# Patient Record
Sex: Male | Born: 1975 | Race: White | Hispanic: No | Smoking: Never smoker
Health system: Southern US, Community
[De-identification: ages and names within clinical notes are randomized; demographics above are authoritative.]

## PROBLEM LIST (undated history)

## (undated) DIAGNOSIS — R569 Unspecified convulsions: Secondary | ICD-10-CM

## (undated) HISTORY — PX: TONSILLECTOMY: SUR1361

---

## 2005-02-09 ENCOUNTER — Emergency Department (HOSPITAL_COMMUNITY): Admission: EM | Admit: 2005-02-09 | Discharge: 2005-02-09 | Payer: Self-pay | Admitting: Emergency Medicine

## 2006-01-15 ENCOUNTER — Emergency Department (HOSPITAL_COMMUNITY): Admission: EM | Admit: 2006-01-15 | Discharge: 2006-01-15 | Payer: Self-pay | Admitting: Emergency Medicine

## 2007-06-11 ENCOUNTER — Emergency Department (HOSPITAL_COMMUNITY): Admission: EM | Admit: 2007-06-11 | Discharge: 2007-06-12 | Payer: Self-pay | Admitting: Emergency Medicine

## 2007-07-13 ENCOUNTER — Inpatient Hospital Stay (HOSPITAL_COMMUNITY): Admission: EM | Admit: 2007-07-13 | Discharge: 2007-07-15 | Payer: Self-pay | Admitting: Emergency Medicine

## 2008-05-29 IMAGING — CR DG CERVICAL SPINE COMPLETE 4+V
7 series · 7 of 7 positions shown · non-contrast
Comparison: 01/15/06.

CLINICAL DATA: Seizure.  Fell and hit head.
 CERVICAL SPINE ? 5 VIEW:
TECHNIQUE: Contiguous axial images were obtained from the base of the skull through the vertex according to standard protocol without contrast.

[view not recorded (1 of 7)]
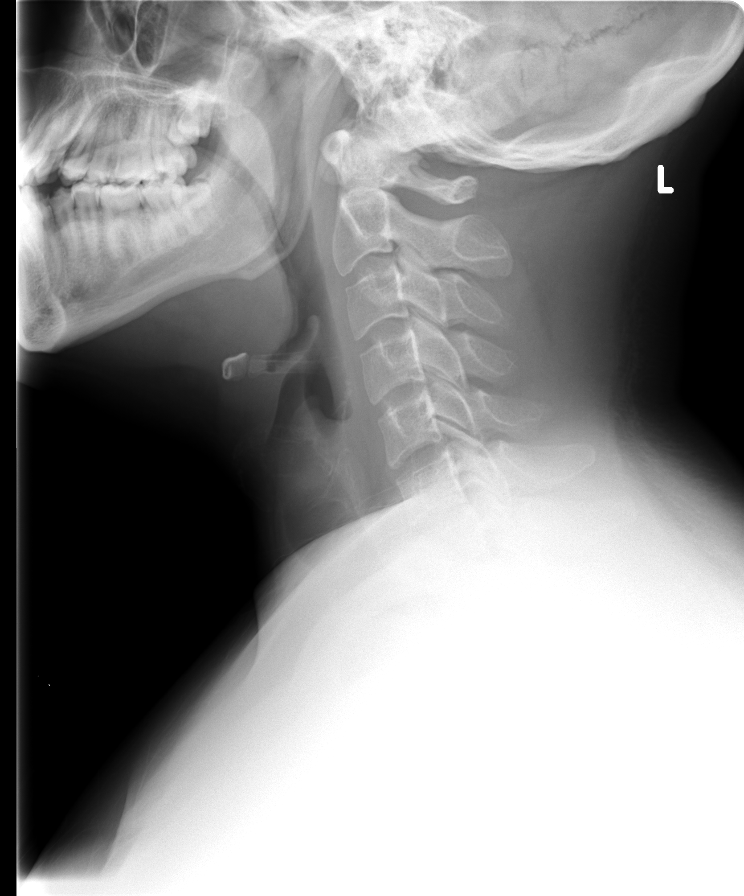

[view not recorded (2 of 7)]
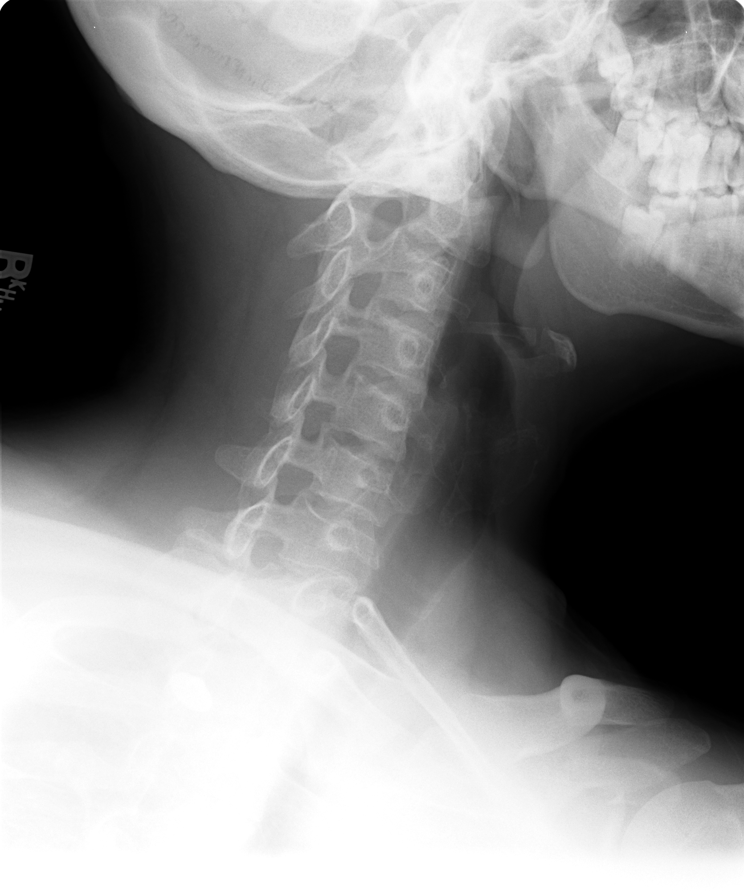

[view not recorded (3 of 7)]
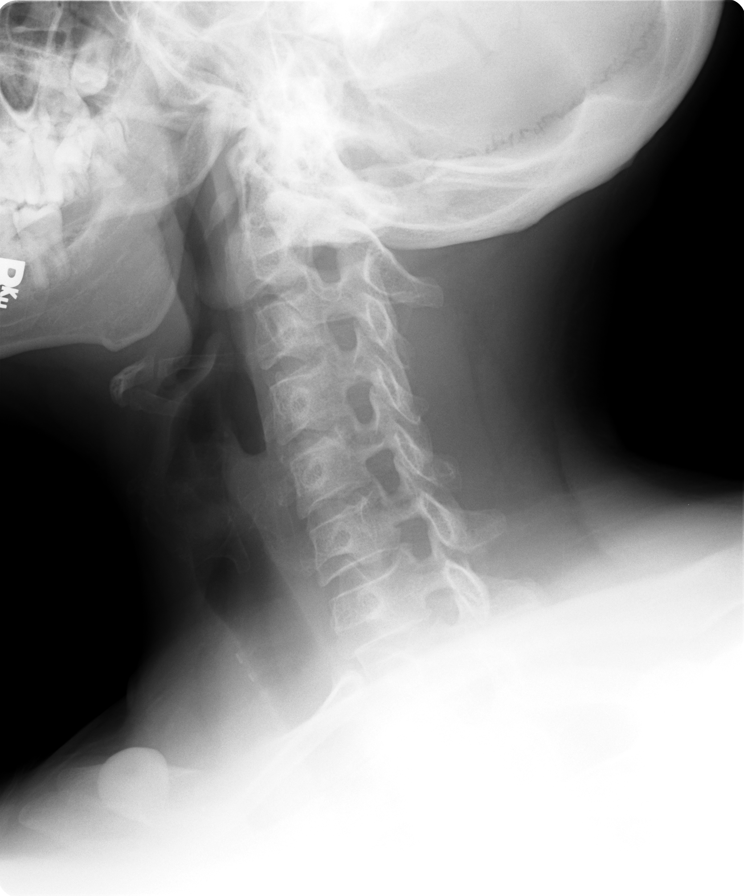

[view not recorded (4 of 7)]
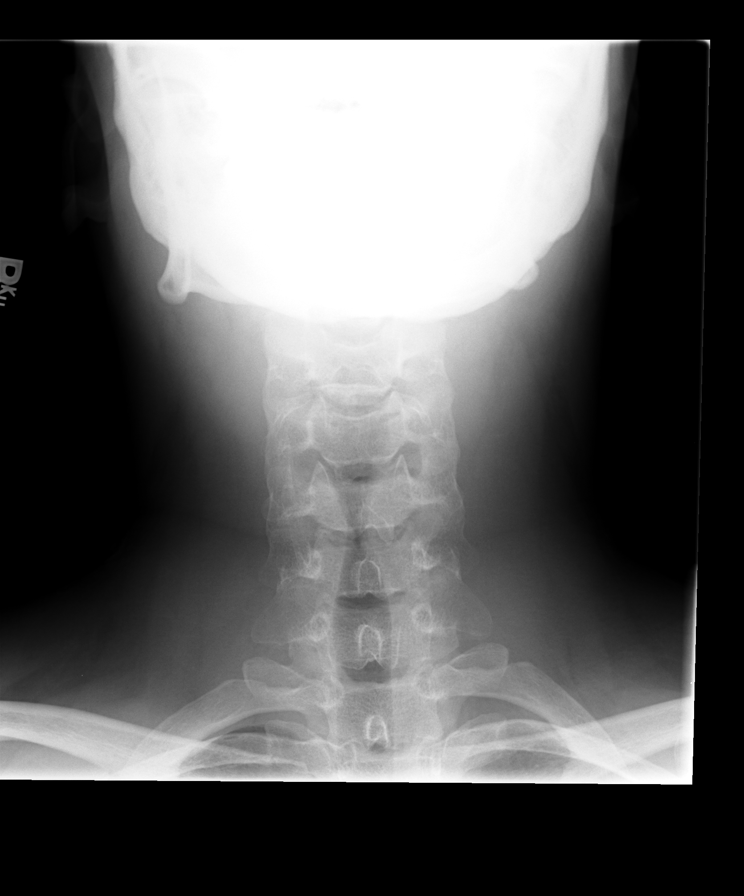

[view not recorded (5 of 7)]
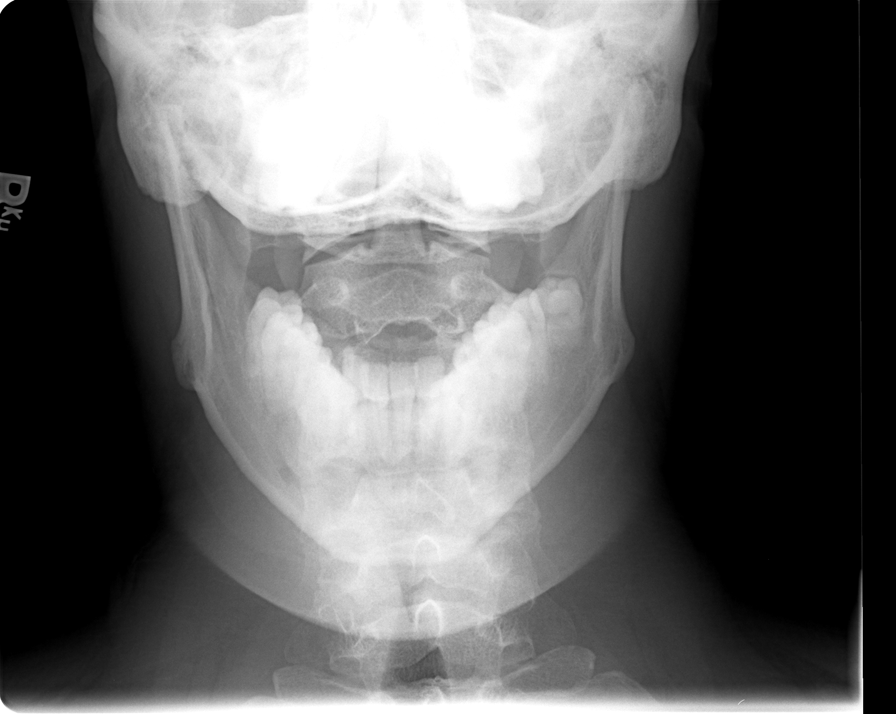

[view not recorded (6 of 7)]
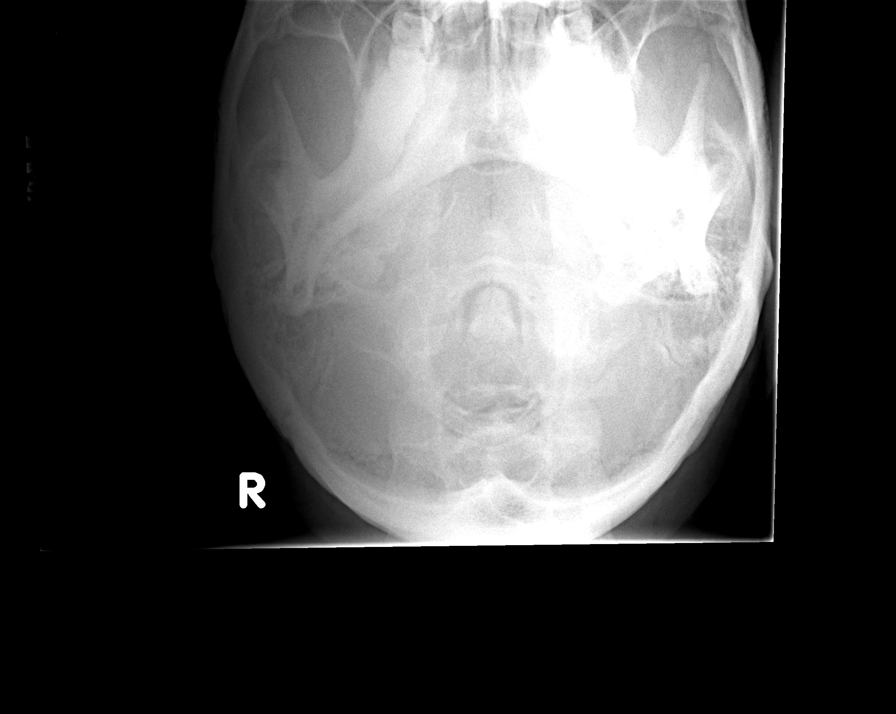

[view not recorded (7 of 7)]
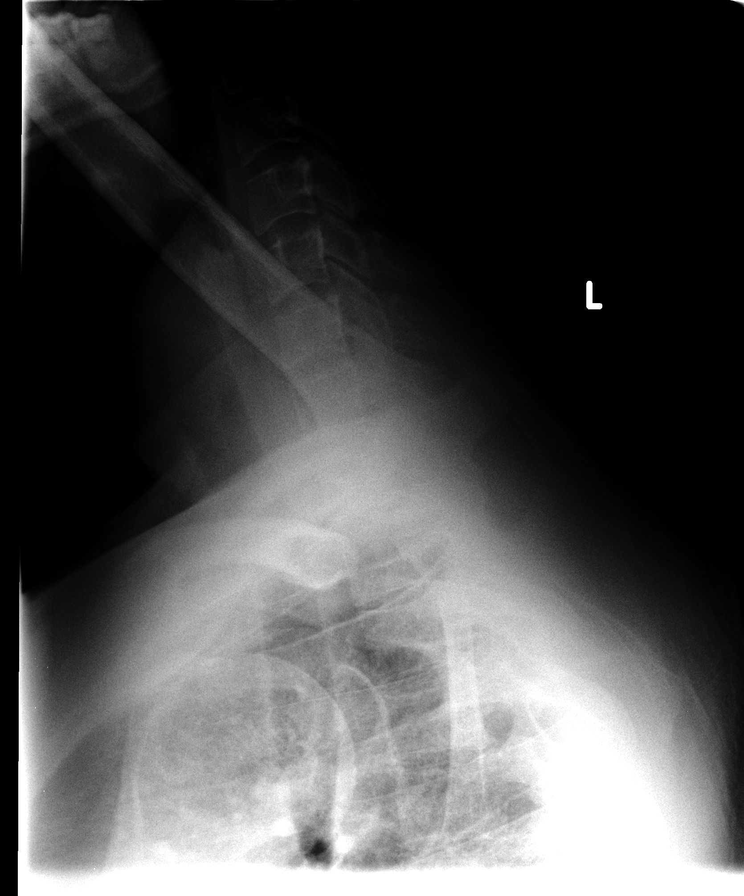

[7 of 7 positions shown; findings below may reference images not displayed]

FINDINGS: There is no evidence of cervical spine fracture or prevertebral soft tissue swelling.  Alignment is normal.  No other significant bone abnormalities are identified.
IMPRESSION: Negative cervical spine radiographs.
 HEAD CT WITHOUT CONTRAST:
FINDINGS: There is no evidence of intracranial hemorrhage, brain edema, acute infarct, mass lesion, or mass effect.  No other intraaxial abnormalities are seen, and the ventricles are within normal limits.  No abnormal extraaxial fluid collections or masses are identified.  No skull abnormalities are noted.
IMPRESSION: Negative non-contrast head CT.

## 2011-05-02 NOTE — Discharge Summary (Signed)
[name], [name]   MEDICAL RECORD NO.:  [id number]          PATIENT TYPE:  INP   LOCATION:  A212                          FACILITY:  [facility]   PHYSICIAN:  Jason Shipper, MD     DATE OF BIRTH:  07-12-1976   DATE OF ADMISSION:  07/13/2007  DATE OF DISCHARGE:  LH                               DISCHARGE SUMMARY   Please see H&P dictated by Dr. Catalina [name] for details for details  regarding the patient's presenting illness.   PRIMARY MEDICAL DOCTOR:  Dr. Artis [name] from [facility].   DISCHARGE DIAGNOSES:  1. Seizure disorder.  2. Recent herpes meningitis, treated.  3. Recent otitis media, treated.   BRIEF HOSPITAL COURSE:  Briefly, this is a 35 year old Caucasian male  who has a history of seizure disorder diagnosed earlier this year.  Prior to June 2008 he has had about three seizure episodes over the  period of the last 3 years.  The patient mentioned that he sustained a  head injury back in 2001 and had a concussion at that time.  He was in  [facility] from June 18 up until June 24 with seizure disorder.  The patient underwent an extensive evaluation at [facility].  He  had a CSF analysis done which did show 200 wbc's with 42% mononuclear,  58% polys - actually, I do not know they are absolute counts or  percentage at this time.  In any case, he was admitted to the hospital.  He had an MRI as well which showed abnormalities which were suggestive  of possible viral encephalitis.  It showed signal abnormalities in the  bilateral, anterior and medial temporal lobes and inferior frontal  lobes.  The patient also had a CT which did not show any abnormalities.  Apparently, he did have an EEG done as well which showed intermittent  surges of slowing noted over the temporal regions that were frequently  maximal on the left.  It should be noted that left side was where he  sustained his head injury.  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  He is  currently not on that any more.  He was also started on Keppra.   The patient presented here to the ED 2 days ago with seizure activity.  He apparently had missed 2 days' worth of Keppra.  He had two seizures  in the ED; hence, he was admitted to the hospital.  We obtained all the  records from [facility] and I have summarized them above.  He has not had  any seizures while he has been on the floor.  He is feeling much better  today.  He has an appointment with the neurologist at [facility] in October  of this year.  Since our neurologist is currently on vacation, and since  the patient has been seizure-free, I believe he can be discharged home  with close followup with Dr. Gerilyn [name] in the next week or two.   Today, the patient is feeling  well.  Denies any headaches, no seizure  activity, no other complaints.  He has been tolerating p.o.  His vital  signs have been stable, he has been afebrile, saturating well.  His  blood work from yesterday was all good.  His Dilantin level was 10.4  yesterday; however, it is probably not an accurate measurement since he  had received Dilantin only 1 day prior.  Hence, I have told him that the  plan is to send him on Keppra and Dilantin for now.  I believe the  Dilantin can be discontinued, but I will defer this issue to Dr.  Gerilyn [name].  We will provide the patient with an information sheet on  Dilantin and teach him about possible side effects and adverse effects.  Prescriptions for both of the above will be written.  Appointment will  be made with Dr. Gerilyn [name].   DISCHARGE MEDICATIONS:  1. Keppra 750 mg p.o. b.i.d.  2. Dilantin 300 mg q.h.s., DAW.  3. Vicodin as needed.  4. He may not continue with Amoxicillin any more.   FOLLOWUP:  1. With Dr. Gerilyn [name] as soon as possible.  2. With his PMD as needed.  3. With [facility] as scheduled.   DIET:  No restrictions.    PHYSICAL EXAMINATION:  He has been told not to drive for at least 26  weeks - that is 6 months - or until he is told so otherwise by the  neurologist.   IMAGING STUDIES DONE HERE:  Include cervical spine film which was  negative and a CT head which was negative as well.   Total time at discharge 35 minutes.      Jason Shipper, MD  Electronically Signed     GK/MEDQ  D:  07/15/2007  T:  07/15/2007  Job:  [id number]   cc:   Darleen Crocker A. Jason [name], M.D.  Fax: [phone]   Madelin Rear. Sherwood Gambler, MD  Fax: [phone]

## 2011-05-02 NOTE — H&P (Signed)
NAMECORNELL, BOURBON NO.:  [id number]   MEDICAL RECORD NO.:  [id number]          PATIENT TYPE:  INP   LOCATION:  A212                          FACILITY:  [facility]   PHYSICIAN:  Catalina Pizza, M.D.        DATE OF BIRTH:  Apr 06, 1976   DATE OF ADMISSION:  07/13/2007  DATE OF DISCHARGE:  LH                              HISTORY & PHYSICAL   PRIMARY CARE PHYSICIAN:  Madelin Rear. Sherwood Gambler, MD.   [facility]   CHIEF COMPLAINT:  Tonic-clonic gran mal seizure x2.   HISTORY OF PRESENT ILLNESS:  Mr. Jason [name] is a 35 year old gentleman who had  a history of seizure, first starting in 2006 x1, and then had another  one in 2007.  He was never put on any medicines at those times.  In June  2008 he had a seizure at work and was taken to [facility], where  as best as I can understand he was diagnosed with herpes meningitis.  He  was treated with acyclovir (it sounds like) IV for 10 days, and then  continued oral medication for another week.  At that time he was started  on Keppra, and has remained on that medication up until 2 days ago --  when he ran out of it, missing 2 doses.  He was at [facility] yesterday,  when he had another episode of approximately one minute of tonic-clonic  type seizure.  He was brought into the emergency department for further  evaluation.  He was loaded with Dilantin and was getting ready to be  discharged to home, when he had another 2-3 minute episode of full tonic-  clonic seizure activity.  He was given a dose of Ativan and admitted for  further evaluation.  Of note, he was seen 2 days ago in the emergency  department, and felt to have an otitis media.  He was started on  amoxicillin and Vicodin for pain control.   PAST MEDICAL HISTORY:  Only significant for the seizure and questioned  meningitis.   MEDICATIONS:  1. Vicodin 5/500 one q.6 h. p.r.n. for pain.  2. Keppra 750 mg b.i.d.  3. Amoxicillin 500 mg q.8 h. x7 days.   FAMILY  HISTORY:  Mother and father are still alive.  Father has a  history of hypertension.  Mother with no health problems.  No other  brothers or sisters.   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  He states he occassionally drinks  alcohol, but very rarely.  He does not smoke and no other drugs.  He is  married and has a 62-year-old son.   REVIEW OF SYSTEMS:  The patient denies any fever or chills.  No nausea  or vomiting.  Occasional headache-type pain.  Denies any specific ear  pain at this time.  Does mention episodes of vertigo when rolling over  in bed, and sometimes has upward gaze.  He denies any chest pain and no  shortness of breath.  No  abdominal pains.  No problems with bowel or  bladder.  No other neurologic complaints.  No lower extremity swelling.   OBJECTIVE:  VITAL SIGNS:  Temperature 98.5, blood pressure 117/60, pulse  103, respirations 16, saturations 97-100% on 2 liters oxygen.  GENERAL:  This is an obese white male, lying in bed in no acute  distress.  HEENT:  Atraumatic.  Has very poor dentition and does have some bite  marks on his tongue on both sides.  Oropharynx though is clear; no  thyromegaly and no carotid bruits.  No JVD.  LUNGS:  Clear to auscultation bilaterally.  CARDIOVASCULAR:  Regular rate and rhythm; no murmur, gallop or rub.  ABDOMEN:  Soft, nontender, nondistended.  Positive bowel sounds.  EXTREMITIES:  No lower extremity edema.  NEUROLOGIC:  The patient is alert and oriented x3.  Cranial nerves II-  XII are intact.  Muscle strength 5/5 in all extremities.  I did not  appreciate any nystagmus.  Extraocular movements all intact.   LABORATORY DATA:  CBC:  White count 7.5, hemoglobin 14.2, platelet count  319.  No signs of alcohol. BMET was normal; BUN 14, creatinine 1.00.  Urinalysis only showed 30 of protein, but negative for everything else.  Urine microbe just showed a few bacteria.   Urine drug screen was  negative for everything.  He did not have any other scans done.   IMPRESSION:  This is a 35 year old gentleman with tonic-clonic seizures  acutely; question whether related to previous diagnosis of herpes  meningitis.   ASSESSMENT AND PLAN:  1. GRAN MAL-TYPE SEIZURE.  He was started and loaded on Dilantin      yesterday.  He did miss one full day of Keppra due to running out,      and questioned whether this exacerbated or brought on these type      seizures.  He was loaded on Dilantin and will continue on the      Dilantin, as well as resume his Keppra.  He has not had any      followup with neurologist since his episode in June 2008.  We will      get a neurologist consult with Dr. Gerilyn Pilgrim to see if he has any      further input related to herpes meningitis causing these type      seizures; and question whether the patient needs further treatment      for that, although it does sound like he did have a prolonged      treatment at [facility] (do not have those records available      at this time).  We will recheck a Dilantin level in the morning.  2. OTITIS MEDIA.  He does not have significant problems at this time;      no effusions in his ear.  We will continue the amoxicillin for now.      He states that it is better than it was 2 or 3 days ago.  3. VERTIGO.  Unclear whether this is related to the seizure, but      states that he has been having this off and on consistently since      he was seen at [facility] in June 2008.  Question whether      this is related to irritation of his vestibular nerve.  No signs of      Ramsey-Hunt type symptoms though.   DISPOSITION:  The patient will continued to be monitored on telemetry  with seizure precautions.  We are awaiting a neurologist consult as well  as a Dilantin level.      Catalina Pizza, M.D.  Electronically Signed     ZH/MEDQ  D:  07/14/2007  T:  07/14/2007  Job:  [id number]

## 2011-10-02 LAB — URINALYSIS, ROUTINE W REFLEX MICROSCOPIC
Glucose, UA: NEGATIVE
Ketones, ur: NEGATIVE
Leukocytes, UA: NEGATIVE
Nitrite: NEGATIVE
Protein, ur: 30 — AB
Specific Gravity, Urine: >1.03 — ABNORMAL HIGH
Urobilinogen, UA: 0.2

## 2011-10-02 LAB — BASIC METABOLIC PANEL
BUN: 10
BUN: 14
CO2: 20
CO2: 27
Chloride: 105
Creatinine, Ser: 1
Creatinine, Ser: 1.13
GFR calc Af Amer: >60
GFR calc Af Amer: >60
Glucose, Bld: 102 — ABNORMAL HIGH
Glucose, Bld: 92
Potassium: 4
Potassium: 4.3
Sodium: 137

## 2011-10-02 LAB — DIFFERENTIAL
Basophils Absolute: 0
Eosinophils Absolute: 0.1
Eosinophils Relative: 2
Monocytes Absolute: 0.8 — ABNORMAL HIGH
Monocytes Relative: 10
Neutrophils Relative %: 69

## 2011-10-02 LAB — URINE MICROSCOPIC-ADD ON

## 2011-10-02 LAB — PHENYTOIN LEVEL, TOTAL: Phenytoin Lvl: 10.7

## 2011-10-02 LAB — RAPID URINE DRUG SCREEN, HOSP PERFORMED
Amphetamines: NOT DETECTED
Barbiturates: NOT DETECTED
Benzodiazepines: NOT DETECTED

## 2011-10-02 LAB — CBC
HCT: 40
HCT: 40.9
Hemoglobin: 14.2
MCHC: 34.3
MCV: 87.3
Platelets: 319
RDW: 14.9 — ABNORMAL HIGH
WBC: 8.6

## 2014-08-07 DIAGNOSIS — R509 Fever, unspecified: Secondary | ICD-10-CM | POA: Diagnosis not present

## 2014-08-07 DIAGNOSIS — K047 Periapical abscess without sinus: Secondary | ICD-10-CM | POA: Insufficient documentation

## 2014-08-07 DIAGNOSIS — R112 Nausea with vomiting, unspecified: Secondary | ICD-10-CM | POA: Insufficient documentation

## 2014-08-07 DIAGNOSIS — Z79899 Other long term (current) drug therapy: Secondary | ICD-10-CM | POA: Diagnosis not present

## 2014-08-07 DIAGNOSIS — G40909 Epilepsy, unspecified, not intractable, without status epilepticus: Secondary | ICD-10-CM | POA: Insufficient documentation

## 2014-08-08 ENCOUNTER — Encounter (HOSPITAL_COMMUNITY): Payer: Self-pay | Admitting: Emergency Medicine

## 2014-08-08 ENCOUNTER — Emergency Department (HOSPITAL_COMMUNITY)
Admission: EM | Admit: 2014-08-08 | Discharge: 2014-08-08 | Disposition: A | Discharge status: Home / Self Care | Payer: BC Managed Care – PPO | Attending: Emergency Medicine | Admitting: Emergency Medicine

## 2014-08-08 DIAGNOSIS — K047 Periapical abscess without sinus: Secondary | ICD-10-CM

## 2014-08-08 HISTORY — DX: Unspecified convulsions: R56.9

## 2014-08-08 MED ORDER — PENICILLIN V POTASSIUM 500 MG PO TABS
500.0000 mg | ORAL_TABLET | Freq: Once | ORAL | Status: AC
  Administered 2014-08-08: 500 mg via ORAL
  Filled 2014-08-08: qty 1

## 2014-08-08 MED ORDER — OXYCODONE-ACETAMINOPHEN 5-325 MG PO TABS: 1.0000 | ORAL_TABLET | ORAL | Status: DC | PRN

## 2014-08-08 MED ORDER — CEPHALEXIN 500 MG PO CAPS: 500.0000 mg | ORAL_CAPSULE | Freq: Four times a day (QID) | ORAL | Status: DC

## 2014-08-08 MED ORDER — ONDANSETRON 4 MG PO TBDP
4.0000 mg | ORAL_TABLET | Freq: Once | ORAL | Status: AC
  Administered 2014-08-08: 4 mg via ORAL
  Filled 2014-08-08: qty 1

## 2014-08-08 MED ORDER — OXYCODONE-ACETAMINOPHEN 5-325 MG PO TABS
1.0000 | ORAL_TABLET | Freq: Once | ORAL | Status: AC
  Administered 2014-08-08: 1 via ORAL
  Filled 2014-08-08: qty 1

## 2014-08-08 MED ORDER — ONDANSETRON 4 MG PO TBDP: ORAL_TABLET | ORAL | Status: DC

## 2014-08-08 NOTE — ED Notes (Signed)
Pt c/o L lower jaw swelling and pain onset yesterday, +fever, +n/v

## 2014-08-08 NOTE — Discharge Instructions (Signed)

## 2014-08-08 NOTE — ED Provider Notes (Signed)
CSN: [id number]     Arrival date & time 08/07/14  2351 History   First MD Initiated Contact with Patient 08/08/14 0134     Chief Complaint  Patient presents with  . Abscess     (Consider location/radiation/quality/duration/timing/severity/associated sxs/prior Treatment) HPI The patient presents with 2 days of left lower canine tooth pain. He's had subjective fevers. States he has been nauseated and has vomited. He has some mild jaw swelling. Patient has had dental abscesses in the past and states this feels the same. He denies any intraoral swelling. Patient says he has an appointment to see his dentist next week. Past Medical History  Diagnosis Date  . Seizures    Past Surgical History  Procedure Laterality Date  . Tonsillectomy     No family history on file. History  Substance Use Topics  . Smoking status: Never Smoker   . Smokeless tobacco: Not on file  . Alcohol Use: No    Review of Systems  Constitutional: Positive for fever. Negative for chills.  HENT: Positive for dental problem and facial swelling. Negative for sore throat.   Respiratory: Negative for shortness of breath.   Gastrointestinal: Positive for nausea and vomiting. Negative for abdominal pain.  Skin: Negative for rash and wound.  Neurological: Negative for dizziness, weakness, light-headedness, numbness and headaches.  All other systems reviewed and are negative.     Allergies  Review of patient's allergies indicates no known allergies.  Home Medications   Prior to Admission medications   Medication Sig Start Date End Date Taking? Authorizing Provider  acetaminophen (TYLENOL) 500 MG tablet Take 1,000 mg by mouth every 6 (six) hours as needed for headache.   Yes Historical Provider, MD  ibuprofen (ADVIL,MOTRIN) 200 MG tablet Take 400 mg by mouth every 6 (six) hours as needed for moderate pain.   Yes Historical Provider, MD  levETIRAcetam (KEPPRA) 750 MG tablet Take 1.5 tablets by mouth 2 (two) times  daily. 08/04/14  Yes Historical Provider, MD  Multiple Vitamin (MULTIVITAMIN WITH MINERALS) TABS tablet Take 1 tablet by mouth daily.   Yes Historical Provider, MD  cephALEXin (KEFLEX) 500 MG capsule Take 1 capsule (500 mg total) by mouth 4 (four) times daily. 08/08/14   Loren Raceravid Tavian Callander, MD  ondansetron (ZOFRAN ODT) 4 MG disintegrating tablet 4mg  ODT q4 hours prn nausea/vomit 08/08/14   Loren Raceravid Alwilda Gilland, MD  oxyCODONE-acetaminophen (PERCOCET) 5-325 MG per tablet Take 1-2 tablets by mouth every 4 (four) hours as needed for severe pain. 08/08/14   Loren Raceravid Khiana Camino, MD   BP 140/82  Pulse 82  Temp(Src) 99.9 F (37.7 C) (Oral)  Resp 18  Ht 5\' 8"  (1.727 m)  Wt 204 lb (92.534 kg)  BMI 31.03 kg/m2  SpO2 98% Physical Exam  Nursing note and vitals reviewed. Constitutional: He is oriented to person, place, and time. He appears well-developed and well-nourished. He appears distressed.  HENT:  Head: Normocephalic and atraumatic.  Mouth/Throat: Oropharynx is clear and moist.  No oral floor swelling or pain. No palpable masses in the gingiva. Patient has some mild left mandibular swelling. No submandibular tenderness or swelling. The left inferior canine is tender to palpation. Poor dentition throughout.   Eyes: EOM are normal. Pupils are equal, round, and reactive to light.  Neck: Normal range of motion. Neck supple.  Cardiovascular: Normal rate and regular rhythm.   Pulmonary/Chest: Effort normal and breath sounds normal. No respiratory distress. He has no wheezes. He has no rales.  Abdominal: Soft. Bowel sounds are normal. He  exhibits no distension and no mass. There is no tenderness. There is no rebound and no guarding.  Musculoskeletal: Normal range of motion. He exhibits no edema and no tenderness.  Neurological: He is alert and oriented to person, place, and time.  Skin: Skin is warm and dry. No rash noted. No erythema.  Psychiatric: He has a normal mood and affect. His behavior is normal.    ED  Course  Procedures (including critical care time) Labs Review Labs Reviewed - No data to display  Imaging Review No results found.   EKG Interpretation None      MDM   Final diagnoses:  Dental abscess    No obvious drainable abscesses. Do not suspect trench mouth. Patient advised to follow up with his dentist. Will start antibiotics and pain control. Return caution is given.    Loren Racer, MD 08/08/14 0400

## 2023-01-04 ENCOUNTER — Emergency Department (HOSPITAL_COMMUNITY): Payer: Self-pay

## 2023-01-04 ENCOUNTER — Encounter (HOSPITAL_COMMUNITY): Payer: Self-pay

## 2023-01-04 ENCOUNTER — Other Ambulatory Visit: Payer: Self-pay

## 2023-01-04 ENCOUNTER — Emergency Department (HOSPITAL_COMMUNITY)
Admission: EM | Admit: 2023-01-04 | Discharge: 2023-01-04 | Disposition: A | Discharge status: Home / Self Care | Payer: Self-pay | Attending: Emergency Medicine | Admitting: Emergency Medicine

## 2023-01-04 DIAGNOSIS — S0081XA Abrasion of other part of head, initial encounter: Secondary | ICD-10-CM | POA: Insufficient documentation

## 2023-01-04 DIAGNOSIS — Y9301 Activity, walking, marching and hiking: Secondary | ICD-10-CM | POA: Insufficient documentation

## 2023-01-04 DIAGNOSIS — R569 Unspecified convulsions: Secondary | ICD-10-CM

## 2023-01-04 DIAGNOSIS — W0110XA Fall on same level from slipping, tripping and stumbling with subsequent striking against unspecified object, initial encounter: Secondary | ICD-10-CM | POA: Insufficient documentation

## 2023-01-04 DIAGNOSIS — S8002XA Contusion of left knee, initial encounter: Secondary | ICD-10-CM | POA: Insufficient documentation

## 2023-01-04 DIAGNOSIS — S00511A Abrasion of lip, initial encounter: Secondary | ICD-10-CM | POA: Insufficient documentation

## 2023-01-04 LAB — CBC WITH DIFFERENTIAL/PLATELET
Abs Immature Granulocytes: 0.04 10*3/uL (ref 0.00–0.07)
Basophils Absolute: 0.1 10*3/uL (ref 0.0–0.1)
Basophils Relative: 1 %
Eosinophils Absolute: 0.2 10*3/uL (ref 0.0–0.5)
Eosinophils Relative: 3 %
HCT: 48.2 % (ref 39.0–52.0)
Hemoglobin: 15.8 g/dL (ref 13.0–17.0)
Immature Granulocytes: 1 %
Lymphocytes Relative: 23 %
Lymphs Abs: 1.6 10*3/uL (ref 0.7–4.0)
MCH: 29.2 pg (ref 26.0–34.0)
MCHC: 32.8 g/dL (ref 30.0–36.0)
MCV: 89.1 fL (ref 80.0–100.0)
Monocytes Absolute: 0.4 10*3/uL (ref 0.1–1.0)
Monocytes Relative: 6 %
Neutro Abs: 4.6 10*3/uL (ref 1.7–7.7)
Neutrophils Relative %: 66 %
Platelets: 309 10*3/uL (ref 150–400)
RBC: 5.41 MIL/uL (ref 4.22–5.81)
RDW: 12.6 % (ref 11.5–15.5)
WBC: 7 10*3/uL (ref 4.0–10.5)
nRBC: 0 % (ref 0.0–0.2)

## 2023-01-04 LAB — BASIC METABOLIC PANEL
Anion gap: 19 — ABNORMAL HIGH (ref 5–15)
BUN: 15 mg/dL (ref 6–20)
CO2: 17 mmol/L — ABNORMAL LOW (ref 22–32)
Calcium: 9.8 mg/dL (ref 8.9–10.3)
Chloride: 104 mmol/L (ref 98–111)
Creatinine, Ser: 1.21 mg/dL (ref 0.61–1.24)
GFR, Estimated: >60 mL/min (ref >60)
Glucose, Bld: 143 mg/dL — ABNORMAL HIGH (ref 70–99)
Potassium: 3.5 mmol/L (ref 3.5–5.1)
Sodium: 140 mmol/L (ref 135–145)

## 2023-01-04 MED ORDER — OXYCODONE HCL 5 MG PO TABS
15.0000 mg | ORAL_TABLET | Freq: Once | ORAL | Status: AC
  Administered 2023-01-04: 15 mg via ORAL
  Filled 2023-01-04: qty 3

## 2023-01-04 MED ORDER — KETOROLAC TROMETHAMINE 15 MG/ML IJ SOLN
15.0000 mg | Freq: Once | INTRAMUSCULAR | Status: AC
  Administered 2023-01-04: 15 mg via INTRAVENOUS
  Filled 2023-01-04: qty 1

## 2023-01-04 MED ORDER — SODIUM CHLORIDE 0.9 % IV BOLUS
500.0000 mL | Freq: Once | INTRAVENOUS | Status: AC
  Administered 2023-01-04: 500 mL via INTRAVENOUS

## 2023-01-04 MED ORDER — OXYCODONE-ACETAMINOPHEN 5-325 MG PO TABS: 2.0000 | ORAL_TABLET | Freq: Once | ORAL | Status: DC

## 2023-01-04 MED ORDER — ONDANSETRON 4 MG PO TBDP
4.0000 mg | ORAL_TABLET | Freq: Once | ORAL | Status: AC
  Administered 2023-01-04: 4 mg via ORAL
  Filled 2023-01-04: qty 1

## 2023-01-04 NOTE — ED Provider Notes (Signed)
[facility] EMERGENCY DEPARTMENT Provider Note   CSN: [id number] Arrival date & time: 01/04/23  1134     History  Chief Complaint  Patient presents with   Seizures    Jason [name] is a 47 y.o. male.  Patient here after seizure episode.  History of seizures on Keppra.  He states compliance.  Took his dose this morning.  He states he has been out for the last 24 hours and he was just about to get the bed when he had a seizure episode.  He is got abrasion to his chin and lower lip per EMS.  He has no teeth at baseline.  He states that he is back to his normal.  He states that he is on chronic narcotics.  Denies any other alcohol or drug use.  He denies any extremity pain, chest pain, shortness of breath.  EKG with EMS is unremarkable.  Blood sugar was normal.  The history is provided by the patient.       Home Medications Prior to Admission medications   Medication Sig Start Date End Date Taking? Authorizing Provider  acetaminophen (TYLENOL) 500 MG tablet Take 1,000 mg by mouth every 6 (six) hours as needed for headache.    [provider]  cephALEXin (KEFLEX) 500 MG capsule Take 1 capsule (500 mg total) by mouth 4 (four) times daily. 08/08/14   Julianne Rice, MD  ibuprofen (ADVIL,MOTRIN) 200 MG tablet Take 400 mg by mouth every 6 (six) hours as needed for moderate pain.    [provider]  levETIRAcetam (KEPPRA) 750 MG tablet Take 1.5 tablets by mouth 2 (two) times daily. 08/04/14   [provider]  Multiple Vitamin (MULTIVITAMIN WITH MINERALS) TABS tablet Take 1 tablet by mouth daily.    [provider]  ondansetron (ZOFRAN ODT) 4 MG disintegrating tablet 4mg  ODT q4 hours prn nausea/vomit 08/08/14   Julianne Rice, MD  oxyCODONE-acetaminophen (PERCOCET) 5-325 MG per tablet Take 1-2 tablets by mouth every 4 (four) hours as needed for severe pain. 08/08/14   Julianne Rice, MD      Allergies    Patient has no known  allergies.    Review of Systems   Review of Systems  Physical Exam Updated Vital Signs BP (!) 131/91   Pulse 92   Temp 98.5 F (36.9 C) (Oral)   Resp 13   Ht 5\' 8"  (1.727 m)   Wt 93 kg   SpO2 97%   BMI 31.17 kg/m  Physical Exam Vitals and nursing note reviewed.  Constitutional:      General: He is not in acute distress.    Appearance: He is well-developed.  HENT:     Head:     Comments: Abrasion to his chin, upper lip Eyes:     Conjunctiva/sclera: Conjunctivae normal.  Cardiovascular:     Rate and Rhythm: Normal rate and regular rhythm.     Heart sounds: No murmur heard. Pulmonary:     Effort: Pulmonary effort is normal. No respiratory distress.     Breath sounds: Normal breath sounds.  Abdominal:     Palpations: Abdomen is soft.     Tenderness: There is no abdominal tenderness.  Musculoskeletal:        General: No swelling.     Cervical back: Normal range of motion and neck supple. No tenderness.  Skin:    General: Skin is warm and dry.     Capillary Refill: Capillary refill takes less than 2 seconds.  Neurological:     General: No focal deficit present.     Mental Status: He is alert and oriented to person, place, and time.     Cranial Nerves: No cranial nerve deficit.     Sensory: No sensory deficit.     Motor: No weakness.     Coordination: Coordination normal.     Comments: 5+ out of 5 strength throughout, normal sensation, no drift, normal speech  Psychiatric:        Mood and Affect: Mood normal.     ED Results / Procedures / Treatments   Labs (all labs ordered are listed, but only abnormal results are displayed) Labs Reviewed  BASIC METABOLIC PANEL - Abnormal; Notable for the following components:      Result Value   CO2 17 (*)    Glucose, Bld 143 (*)    Anion gap 19 (*)    All other components within normal limits  CBC WITH DIFFERENTIAL/PLATELET    EKG EKG Interpretation  Date/Time:  Thursday January 04 2023 11:38:58 EST Ventricular  Rate:  107 PR Interval:  160 QRS Duration: 96 QT Interval:  340 QTC Calculation: 454 R Axis:   77 Text Interpretation: Sinus tachycardia Confirmed by Virgina Norfolk ([id number]) on 01/04/2023 11:57:25 AM  Radiology DG Shoulder Left  Result Date: 01/04/2023 CLINICAL DATA:  Pain/trauma. EXAM: LEFT SHOULDER - 2+ VIEW COMPARISON:  None Available. FINDINGS: Three views. No acute fracture or dislocation of the left shoulder. Well corticated irregularity of the distal clavicle with associated widening of the left AC joint is most consistent with distal clavicular osteolysis. IMPRESSION: 1. No acute fracture or dislocation of the left shoulder. 2. Left distal clavicular osteolysis. Electronically Signed   By: Orvan Falconer M.D.   On: 01/04/2023 13:34   DG Knee Complete 4 Views Left  Result Date: 01/04/2023 CLINICAL DATA:  Pain, trauma EXAM: LEFT KNEE - COMPLETE 4+ VIEW COMPARISON:  None Available. FINDINGS: No evidence of fracture, dislocation, or joint effusion. No evidence of arthropathy or other focal bone abnormality. Soft tissues are unremarkable. IMPRESSION: Negative. Electronically Signed   By: Emmaline Kluver M.D.   On: 01/04/2023 13:29   CT Maxillofacial Wo Contrast  Result Date: 01/04/2023 CLINICAL DATA:  Head trauma EXAM: CT HEAD WITHOUT CONTRAST CT MAXILLOFACIAL WITHOUT CONTRAST CT CERVICAL SPINE WITHOUT CONTRAST TECHNIQUE: Multidetector CT imaging of the head, cervical spine, and maxillofacial structures were performed using the standard protocol without intravenous contrast. Multiplanar CT image reconstructions of the cervical spine and maxillofacial structures were also generated. RADIATION DOSE REDUCTION: This exam was performed according to the departmental dose-optimization program which includes automated exposure control, adjustment of the mA and/or kV according to patient size and/or use of iterative reconstruction technique. COMPARISON:  09/28/2022 FINDINGS: CT HEAD FINDINGS Brain: No  evidence of acute infarction, hemorrhage, hydrocephalus, extra-axial collection or mass lesion/mass effect. Vascular: No hyperdense vessel or unexpected calcification. CT FACIAL BONES FINDINGS Skull: Normal. Negative for fracture or focal lesion. Facial bones: No displaced fractures or dislocations. Sinuses/Orbits: No acute finding. Other: Multiple dental caries. CT CERVICAL SPINE FINDINGS Alignment: Normal. Skull base and vertebrae: No acute fracture. No primary bone lesion or focal pathologic process. Soft tissues and spinal canal: No prevertebral fluid or swelling. No visible canal hematoma. Disc levels:  Mild multilevel disc space loss and osteophytosis. Upper chest: Negative. Other: None. IMPRESSION: 1. No acute intracranial pathology. 2. No displaced fractures or dislocations of the facial bones. 3. No fracture or static subluxation of the cervical spine.  Electronically Signed   By: Delanna Ahmadi M.D.   On: 01/04/2023 12:28   CT Head Wo Contrast  Result Date: 01/04/2023 CLINICAL DATA:  Head trauma EXAM: CT HEAD WITHOUT CONTRAST CT MAXILLOFACIAL WITHOUT CONTRAST CT CERVICAL SPINE WITHOUT CONTRAST TECHNIQUE: Multidetector CT imaging of the head, cervical spine, and maxillofacial structures were performed using the standard protocol without intravenous contrast. Multiplanar CT image reconstructions of the cervical spine and maxillofacial structures were also generated. RADIATION DOSE REDUCTION: This exam was performed according to the departmental dose-optimization program which includes automated exposure control, adjustment of the mA and/or kV according to patient size and/or use of iterative reconstruction technique. COMPARISON:  09/28/2022 FINDINGS: CT HEAD FINDINGS Brain: No evidence of acute infarction, hemorrhage, hydrocephalus, extra-axial collection or mass lesion/mass effect. Vascular: No hyperdense vessel or unexpected calcification. CT FACIAL BONES FINDINGS Skull: Normal. Negative for fracture or  focal lesion. Facial bones: No displaced fractures or dislocations. Sinuses/Orbits: No acute finding. Other: Multiple dental caries. CT CERVICAL SPINE FINDINGS Alignment: Normal. Skull base and vertebrae: No acute fracture. No primary bone lesion or focal pathologic process. Soft tissues and spinal canal: No prevertebral fluid or swelling. No visible canal hematoma. Disc levels:  Mild multilevel disc space loss and osteophytosis. Upper chest: Negative. Other: None. IMPRESSION: 1. No acute intracranial pathology. 2. No displaced fractures or dislocations of the facial bones. 3. No fracture or static subluxation of the cervical spine. Electronically Signed   By: Delanna Ahmadi M.D.   On: 01/04/2023 12:28   CT Cervical Spine Wo Contrast  Result Date: 01/04/2023 CLINICAL DATA:  Head trauma EXAM: CT HEAD WITHOUT CONTRAST CT MAXILLOFACIAL WITHOUT CONTRAST CT CERVICAL SPINE WITHOUT CONTRAST TECHNIQUE: Multidetector CT imaging of the head, cervical spine, and maxillofacial structures were performed using the standard protocol without intravenous contrast. Multiplanar CT image reconstructions of the cervical spine and maxillofacial structures were also generated. RADIATION DOSE REDUCTION: This exam was performed according to the departmental dose-optimization program which includes automated exposure control, adjustment of the mA and/or kV according to patient size and/or use of iterative reconstruction technique. COMPARISON:  09/28/2022 FINDINGS: CT HEAD FINDINGS Brain: No evidence of acute infarction, hemorrhage, hydrocephalus, extra-axial collection or mass lesion/mass effect. Vascular: No hyperdense vessel or unexpected calcification. CT FACIAL BONES FINDINGS Skull: Normal. Negative for fracture or focal lesion. Facial bones: No displaced fractures or dislocations. Sinuses/Orbits: No acute finding. Other: Multiple dental caries. CT CERVICAL SPINE FINDINGS Alignment: Normal. Skull base and vertebrae: No acute fracture.  No primary bone lesion or focal pathologic process. Soft tissues and spinal canal: No prevertebral fluid or swelling. No visible canal hematoma. Disc levels:  Mild multilevel disc space loss and osteophytosis. Upper chest: Negative. Other: None. IMPRESSION: 1. No acute intracranial pathology. 2. No displaced fractures or dislocations of the facial bones. 3. No fracture or static subluxation of the cervical spine. Electronically Signed   By: Delanna Ahmadi M.D.   On: 01/04/2023 12:28    Procedures Procedures    Medications Ordered in ED Medications  oxyCODONE (Oxy IR/ROXICODONE) immediate release tablet 15 mg (has no administration in time range)  sodium chloride 0.9 % bolus 500 mL (0 mLs Intravenous Stopped 01/04/23 1248)    ED Course/ Medical Decision Making/ A&P                             Medical Decision Making Amount and/or Complexity of Data Reviewed Labs: ordered. Radiology: ordered.  Risk Prescription drug management.  Jason [name] is here after seizure episode.  He is back to his baseline.  Normal vitals.  No fever.  Discussed some facial trauma on exam with abrasion to his upper lip and chin.  He states his tetanus shot is up-to-date.  He states that he has been taking his Keppra as prescribed.  Took his morning dose.  He has been up for over 24 hours.  He denies any alcohol or drug use.  He states that he takes narcotics at baseline.  He is a little bit hesitant to give me history about why he has been awake for 24 hours.  Overall he appears neurologically intact.  His vital signs are unremarkable.  His EKG per my review and interpretation shows sinus rhythm.  No ischemic changes.  Ultimately will check basic labs including CBC and BMP and get a CT scan of his face, neck, head to evaluate for traumatic processes.  Will continue to observe him.  He states that he has breakthrough seizures every once in a while.  I suspect that this is in the setting of lack of sleep.  Per chart  review does follow with neurology in the [facility] system.  He had a seizure since he was a kid.  Per my review and interpretation of head and neck and face CT there are no acute findings.  Radiology report also with no acute findings.  Per my review and interpretation of labs patient with no significant anemia or leukocytosis.  Electrolytes are unremarkable.  Bicarb is 17 anion gap of 19 likely in the setting of seizure episode.  Glucose is unremarkable.  He denies any alcohol use.  This is likely from mild transient lactic acidosis from seizure activity.  He is back at his baseline.  He has chronic pain and he takes Roxicodone 50 mg every 4 hours.  Will give him a dose of that while he awaits x-ray of his left shoulder and left knee.  He seems to be back to his normal.  Anticipate discharge.  X-rays per my review interpretation unremarkable.  Overall breakthrough seizure likely in the setting of poor sleep.  Discharged in good condition.  Understands return precautions.  This chart was dictated using voice recognition software.  Despite best efforts to proofread,  errors can occur which can change the documentation meaning.         Final Clinical Impression(s) / ED Diagnoses Final diagnoses:  Seizure (HCC)  Contusion of left knee, initial encounter  Abrasion of face, initial encounter    Rx / DC Orders ED Discharge Orders     None         Virgina Norfolk, DO 01/04/23 1338

## 2023-01-04 NOTE — ED Triage Notes (Signed)
Patient has hx seizure. Patient had seizure while walking outside. Patient fell and hit face. Patient has bruising to gums. Patient has no top teeth at baseline. Patient has small laceration to chin. Patient is complain with medications  164/106 HR124 O2 [id number]

## 2023-01-04 NOTE — Discharge Instructions (Addendum)
Follow-up with your neurologist.  Continue take your medications as prescribed.  Recommend soap and water to your facial wounds and then Neosporin or bacitracin ointment twice daily.  Please get plenty of rest today.

## 2024-01-25 ENCOUNTER — Encounter (HOSPITAL_COMMUNITY): Payer: Medicaid Other

## 2024-01-25 ENCOUNTER — Emergency Department (HOSPITAL_COMMUNITY): Payer: Medicaid Other

## 2024-01-25 ENCOUNTER — Encounter (HOSPITAL_COMMUNITY): Payer: Self-pay | Admitting: *Deleted

## 2024-01-25 ENCOUNTER — Inpatient Hospital Stay (HOSPITAL_COMMUNITY)
Admission: EM | Admit: 2024-01-25 | Discharge: 2024-01-28 | DRG: 101 | Disposition: A | Discharge status: Home / Self Care | Payer: Medicaid Other | Attending: Internal Medicine | Admitting: Internal Medicine

## 2024-01-25 ENCOUNTER — Other Ambulatory Visit: Payer: Self-pay

## 2024-01-25 DIAGNOSIS — S0003XA Contusion of scalp, initial encounter: Secondary | ICD-10-CM | POA: Diagnosis present

## 2024-01-25 DIAGNOSIS — Y990 Civilian activity done for income or pay: Secondary | ICD-10-CM

## 2024-01-25 DIAGNOSIS — F1910 Other psychoactive substance abuse, uncomplicated: Secondary | ICD-10-CM | POA: Diagnosis not present

## 2024-01-25 DIAGNOSIS — G9349 Other encephalopathy: Secondary | ICD-10-CM | POA: Diagnosis present

## 2024-01-25 DIAGNOSIS — Y9289 Other specified places as the place of occurrence of the external cause: Secondary | ICD-10-CM

## 2024-01-25 DIAGNOSIS — Z79891 Long term (current) use of opiate analgesic: Secondary | ICD-10-CM | POA: Diagnosis not present

## 2024-01-25 DIAGNOSIS — M549 Dorsalgia, unspecified: Secondary | ICD-10-CM | POA: Diagnosis present

## 2024-01-25 DIAGNOSIS — W19XXXA Unspecified fall, initial encounter: Secondary | ICD-10-CM | POA: Diagnosis present

## 2024-01-25 DIAGNOSIS — G9341 Metabolic encephalopathy: Secondary | ICD-10-CM

## 2024-01-25 DIAGNOSIS — Z8661 Personal history of infections of the central nervous system: Secondary | ICD-10-CM | POA: Diagnosis not present

## 2024-01-25 DIAGNOSIS — Z79899 Other long term (current) drug therapy: Secondary | ICD-10-CM | POA: Diagnosis not present

## 2024-01-25 DIAGNOSIS — R569 Unspecified convulsions: Secondary | ICD-10-CM | POA: Diagnosis not present

## 2024-01-25 DIAGNOSIS — F1490 Cocaine use, unspecified, uncomplicated: Secondary | ICD-10-CM | POA: Diagnosis present

## 2024-01-25 DIAGNOSIS — K029 Dental caries, unspecified: Secondary | ICD-10-CM

## 2024-01-25 DIAGNOSIS — Z885 Allergy status to narcotic agent status: Secondary | ICD-10-CM | POA: Diagnosis not present

## 2024-01-25 DIAGNOSIS — G8929 Other chronic pain: Secondary | ICD-10-CM | POA: Diagnosis present

## 2024-01-25 DIAGNOSIS — R4182 Altered mental status, unspecified: Secondary | ICD-10-CM | POA: Diagnosis present

## 2024-01-25 DIAGNOSIS — G40901 Epilepsy, unspecified, not intractable, with status epilepticus: Principal | ICD-10-CM | POA: Diagnosis present

## 2024-01-25 LAB — RAPID URINE DRUG SCREEN, HOSP PERFORMED
Amphetamines: NOT DETECTED
Barbiturates: NOT DETECTED
Benzodiazepines: NOT DETECTED
Cocaine: POSITIVE — AB
Opiates: POSITIVE — AB
Tetrahydrocannabinol: NOT DETECTED

## 2024-01-25 LAB — CBC WITH DIFFERENTIAL/PLATELET
Abs Immature Granulocytes: 0 10*3/uL (ref 0.00–0.07)
Basophils Absolute: 0 10*3/uL (ref 0.0–0.1)
Basophils Relative: 0 %
Eosinophils Absolute: 0.1 10*3/uL (ref 0.0–0.5)
Eosinophils Relative: 3 %
HCT: 37.2 % — ABNORMAL LOW (ref 39.0–52.0)
Hemoglobin: 12.6 g/dL — ABNORMAL LOW (ref 13.0–17.0)
Lymphocytes Relative: 44 %
Lymphs Abs: 1.5 10*3/uL (ref 0.7–4.0)
MCH: 29 pg (ref 26.0–34.0)
MCHC: 33.9 g/dL (ref 30.0–36.0)
MCV: 85.5 fL (ref 80.0–100.0)
Monocytes Absolute: 0.2 10*3/uL (ref 0.1–1.0)
Monocytes Relative: 5 %
Neutro Abs: 1.6 10*3/uL — ABNORMAL LOW (ref 1.7–7.7)
Neutrophils Relative %: 48 %
Platelets: 212 10*3/uL (ref 150–400)
RBC: 4.35 MIL/uL (ref 4.22–5.81)
RDW: 12.7 % (ref 11.5–15.5)
WBC: 3.4 10*3/uL — ABNORMAL LOW (ref 4.0–10.5)
nRBC: 0 % (ref 0.0–0.2)
nRBC: 0 /100{WBCs}

## 2024-01-25 LAB — COMPREHENSIVE METABOLIC PANEL
ALT: 23 U/L (ref 0–44)
AST: 32 U/L (ref 15–41)
Albumin: 3.9 g/dL (ref 3.5–5.0)
Alkaline Phosphatase: 52 U/L (ref 38–126)
Anion gap: 10 (ref 5–15)
BUN: 11 mg/dL (ref 6–20)
CO2: 24 mmol/L (ref 22–32)
Calcium: 8.7 mg/dL — ABNORMAL LOW (ref 8.9–10.3)
Chloride: 106 mmol/L (ref 98–111)
Creatinine, Ser: 0.92 mg/dL (ref 0.61–1.24)
GFR, Estimated: >60 mL/min (ref >60)
Glucose, Bld: 101 mg/dL — ABNORMAL HIGH (ref 70–99)
Potassium: 3.6 mmol/L (ref 3.5–5.1)
Sodium: 140 mmol/L (ref 135–145)
Total Bilirubin: 0.3 mg/dL (ref 0.0–1.2)
Total Protein: 7.2 g/dL (ref 6.5–8.1)

## 2024-01-25 LAB — HIV ANTIBODY (ROUTINE TESTING W REFLEX): HIV Screen 4th Generation wRfx: NONREACTIVE

## 2024-01-25 LAB — MAGNESIUM: Magnesium: 2.2 mg/dL (ref 1.7–2.4)

## 2024-01-25 LAB — GLUCOSE, CAPILLARY
Glucose-Capillary: 82 mg/dL (ref 70–99)
Glucose-Capillary: 93 mg/dL (ref 70–99)

## 2024-01-25 LAB — MRSA NEXT GEN BY PCR, NASAL: MRSA by PCR Next Gen: NOT DETECTED

## 2024-01-25 LAB — PROCALCITONIN: Procalcitonin: <0.1 ng/mL

## 2024-01-25 LAB — I-STAT CG4 LACTIC ACID, ED: Lactic Acid, Venous: 3.4 mmol/L (ref 0.5–1.9)

## 2024-01-25 LAB — SALICYLATE LEVEL: Salicylate Lvl: <7 mg/dL — ABNORMAL LOW (ref 7.0–30.0)

## 2024-01-25 LAB — ETHANOL: Alcohol, Ethyl (B): <10 mg/dL (ref <10)

## 2024-01-25 LAB — ACETAMINOPHEN LEVEL: Acetaminophen (Tylenol), Serum: <10 ug/mL — ABNORMAL LOW (ref 10–30)

## 2024-01-25 MED ORDER — SODIUM CHLORIDE 0.9 % IV SOLN
750.0000 mg | Freq: Two times a day (BID) | INTRAVENOUS | Status: DC
  Administered 2024-01-25: 750 mg via INTRAVENOUS
  Filled 2024-01-25 (×5): qty 7.5

## 2024-01-25 MED ORDER — ORAL CARE MOUTH RINSE: 15.0000 mL | OROMUCOSAL | Status: DC | PRN

## 2024-01-25 MED ORDER — LEVETIRACETAM IN NACL 1500 MG/100ML IV SOLN
1500.0000 mg | Freq: Once | INTRAVENOUS | Status: AC
  Administered 2024-01-25: 1500 mg via INTRAVENOUS
  Filled 2024-01-25: qty 100

## 2024-01-25 MED ORDER — POLYETHYLENE GLYCOL 3350 17 G PO PACK: 17.0000 g | PACK | Freq: Every day | ORAL | Status: DC | PRN

## 2024-01-25 MED ORDER — SODIUM CHLORIDE 0.9 % IV SOLN
1500.0000 mg | Freq: Once | INTRAVENOUS | Status: AC
  Administered 2024-01-25: 1500 mg via INTRAVENOUS
  Filled 2024-01-25: qty 30

## 2024-01-25 MED ORDER — DOCUSATE SODIUM 100 MG PO CAPS: 100.0000 mg | ORAL_CAPSULE | Freq: Two times a day (BID) | ORAL | Status: DC | PRN

## 2024-01-25 MED ORDER — SODIUM CHLORIDE 0.9 % IV BOLUS
1000.0000 mL | Freq: Once | INTRAVENOUS | Status: AC
  Administered 2024-01-25: 1000 mL via INTRAVENOUS

## 2024-01-25 MED ORDER — LORAZEPAM 2 MG/ML IJ SOLN: 2.0000 mg | INTRAMUSCULAR | Status: DC | PRN

## 2024-01-25 MED ORDER — ORAL CARE MOUTH RINSE
15.0000 mL | OROMUCOSAL | Status: DC
  Administered 2024-01-25 – 2024-01-26 (×5): 15 mL via OROMUCOSAL

## 2024-01-25 MED ORDER — LORAZEPAM 2 MG/ML IJ SOLN
INTRAMUSCULAR | Status: AC
  Administered 2024-01-25: 2 mg
  Filled 2024-01-25: qty 1

## 2024-01-25 MED ORDER — SODIUM CHLORIDE 0.9 % IV SOLN: INTRAVENOUS | Status: AC

## 2024-01-25 MED ORDER — KETOROLAC TROMETHAMINE 15 MG/ML IJ SOLN
15.0000 mg | Freq: Four times a day (QID) | INTRAMUSCULAR | Status: AC | PRN
  Administered 2024-01-25 – 2024-01-26 (×2): 15 mg via INTRAVENOUS
  Filled 2024-01-25 (×2): qty 1

## 2024-01-25 MED ORDER — KETOROLAC TROMETHAMINE 15 MG/ML IJ SOLN: 15.0000 mg | Freq: Once | INTRAMUSCULAR | Status: DC

## 2024-01-25 MED ORDER — PHENYTOIN SODIUM 50 MG/ML IJ SOLN
100.0000 mg | Freq: Three times a day (TID) | INTRAMUSCULAR | Status: DC
  Administered 2024-01-25 – 2024-01-26 (×2): 100 mg via INTRAVENOUS
  Filled 2024-01-25 (×3): qty 2

## 2024-01-25 MED ORDER — CHLORHEXIDINE GLUCONATE CLOTH 2 % EX PADS
6.0000 | MEDICATED_PAD | Freq: Every day | CUTANEOUS | Status: DC
  Administered 2024-01-25 – 2024-01-26 (×2): 6 via TOPICAL

## 2024-01-25 MED ORDER — ORAL CARE MOUTH RINSE: 15.0000 mL | OROMUCOSAL | Status: DC

## 2024-01-25 MED ORDER — ACETAMINOPHEN 500 MG PO TABS
1000.0000 mg | ORAL_TABLET | Freq: Once | ORAL | Status: AC
  Administered 2024-01-25: 1000 mg via ORAL
  Filled 2024-01-25: qty 2

## 2024-01-25 NOTE — ED Triage Notes (Signed)
 Patient presents to ed via [employer], states patient was at work and had an unwitnessed fall patient has a history of seizures and is on Keppra  unsure of last dose. Patient has is alert and oriented to name and date of birth, Patient has dried blood around his mouth and controled bleeding to left posterior head, patient will follow commands pupils 2 bilaterally and reactive.

## 2024-01-25 NOTE — ED Provider Notes (Signed)
 [facility] [facility] Provider Note   CSN: [id number] Arrival date & time: 01/25/24  0344     History  Chief Complaint  Patient presents with   Altered Mental Status    Jason [name] is a 48 y.o. male.  The history is provided by the patient, the EMS personnel and medical records.  Altered Mental Status Jason [name] is a 48 y.o. male who presents to the Emergency Department complaining of altered mental status.  He presents to the emergency department by EMS after being found down at work, unknown downtime.  He did have evidence of facial trauma.  On record review he does have a history of seizure disorder.     Home Medications Prior to Admission medications   Medication Sig Start Date End Date Taking? Authorizing Provider  acetaminophen  (TYLENOL ) 500 MG tablet Take 1,000 mg by mouth every 6 (six) hours as needed for headache.    [provider]  cephALEXin  (KEFLEX ) 500 MG capsule Take 1 capsule (500 mg total) by mouth 4 (four) times daily. 08/08/14   Carlyle Lenis, MD  ibuprofen (ADVIL,MOTRIN) 200 MG tablet Take 400 mg by mouth every 6 (six) hours as needed for moderate pain.    [provider]  levETIRAcetam  (KEPPRA ) 750 MG tablet Take 1.5 tablets by mouth 2 (two) times daily. 08/04/14   [provider]  Multiple Vitamin (MULTIVITAMIN WITH MINERALS) TABS tablet Take 1 tablet by mouth daily.    [provider]  ondansetron  (ZOFRAN  ODT) 4 MG disintegrating tablet 4mg  ODT q4 hours prn nausea/vomit 08/08/14   Carlyle Lenis, MD  oxyCODONE -acetaminophen  (PERCOCET) 5-325 MG per tablet Take 1-2 tablets by mouth every 4 (four) hours as needed for severe pain. 08/08/14   Carlyle Lenis, MD      Allergies    Patient has no known allergies.    Review of Systems   Review of Systems  All other systems reviewed and are negative.   Physical Exam Updated Vital Signs BP (!) 118/94   Pulse 63   Temp 98.3 F  (36.8 C) (Oral)   Resp 13   Ht 5' 8 (1.727 m)   Wt 93 kg   SpO2 95%   BMI 31.17 kg/m  Physical Exam Vitals and nursing note reviewed.  Constitutional:      Appearance: He is well-developed.  HENT:     Head: Normocephalic.     Comments: Dried blood in the naris, there are multiple missing teeth with blood coming from gingival base 9.  There are numerous carious teeth as well.  No blood in the posterior oropharynx Cardiovascular:     Rate and Rhythm: Normal rate and regular rhythm.     Heart sounds: No murmur heard. Pulmonary:     Effort: Pulmonary effort is normal. No respiratory distress.     Breath sounds: Normal breath sounds.  Abdominal:     Palpations: Abdomen is soft.     Tenderness: There is no abdominal tenderness. There is no guarding or rebound.  Musculoskeletal:        General: No tenderness.  Skin:    General: Skin is warm and dry.  Neurological:     Mental Status: He is alert.     Comments: Confused.  Moves all extremities symmetrically and strongly.  Slow to answer questions.  Disoriented to place, time and recent events.  Psychiatric:        Behavior: Behavior normal.     ED Results /  Procedures / Treatments   Labs (all labs ordered are listed, but only abnormal results are displayed) Labs Reviewed  COMPREHENSIVE METABOLIC PANEL - Abnormal; Notable for the following components:      Result Value   Glucose, Bld 101 (*)    Calcium 8.7 (*)    All other components within normal limits  CBC WITH DIFFERENTIAL/PLATELET - Abnormal; Notable for the following components:   WBC 3.4 (*)    Hemoglobin 12.6 (*)    HCT 37.2 (*)    Neutro Abs 1.6 (*)    All other components within normal limits  ETHANOL  CBG MONITORING, ED    EKG EKG Interpretation Date/Time:  Friday January 25 2024 03:49:11 EST Ventricular Rate:  83 PR Interval:  192 QRS Duration:  93 QT Interval:  376 QTC Calculation: 442 R Axis:   73  Text Interpretation: Sinus rhythm Abnormal  R-wave progression, early transition Confirmed by Griselda Norris [phone]) on 01/25/2024 3:59:38 AM  Radiology CT Cervical Spine Wo Contrast Result Date: 01/25/2024 CLINICAL DATA:  48 year old male status post unwitnessed fall at work, posterior head bleeding. Seizure history. EXAM: CT CERVICAL SPINE WITHOUT CONTRAST TECHNIQUE: Multidetector CT imaging of the cervical spine was performed without intravenous contrast. Multiplanar CT image reconstructions were also generated. RADIATION DOSE REDUCTION: This exam was performed according to the departmental dose-optimization program which includes automated exposure control, adjustment of the mA and/or kV according to patient size and/or use of iterative reconstruction technique. COMPARISON:  CT head and face today.  Cervical spine CT 01/04/2023. FINDINGS: Alignment: Stable straightening of cervical lordosis. Cervicothoracic junction alignment is within normal limits. Bilateral posterior element alignment is within normal limits. Skull base and vertebrae: Normal background bone mineralization. Visualized skull base is intact. No atlanto-occipital dissociation. C1 and C2 appear intact and aligned. No acute osseous abnormality identified. Soft tissues and spinal canal: No prevertebral fluid or swelling. No visible canal hematoma. Negative visible noncontrast deep soft tissue spaces of the neck. Disc levels: Chronic cervical disc and endplate degeneration C4-C5 through C6-C7, stable and mild to moderate for age. Upper chest: Mild T4 superior endplate compression is age indeterminate. Other visible upper thoracic vertebrae, ribs appear intact. Retained secretions is *CRASH* that small volume of retained secretions throughout the visible trachea, extending to the left mainstem bronchus. Visible upper lobes are clear. There is more mild tree-in-bud nodular opacity in the superior segment of the left lower lobe on series 4, image 126. Negative visible noncontrast mediastinum.  IMPRESSION: 1. No acute traumatic injury identified in the cervical spine. 2. Age indeterminate mild T4 compression fracture. 3. Layering secretions in the trachea and left mainstem bronchus, and mild tree-in-bud nodular opacity in the superior segment of the left lower lobe. Constellation suspicious for mild aspiration, mild distal airway inflammation. Electronically Signed   By: VEAR Hurst M.D.   On: 01/25/2024 04:41   CT Maxillofacial WO CM Result Date: 01/25/2024 CLINICAL DATA:  48 year old male status post unwitnessed fall at work, posterior head bleeding. Seizure history. EXAM: CT MAXILLOFACIAL WITHOUT CONTRAST TECHNIQUE: Multidetector CT imaging of the maxillofacial structures was performed. Multiplanar CT image reconstructions were also generated. RADIATION DOSE REDUCTION: This exam was performed according to the departmental dose-optimization program which includes automated exposure control, adjustment of the mA and/or kV according to patient size and/or use of iterative reconstruction technique. COMPARISON:  CT head and cervical spine today. Prior face CT 01/04/2023. FINDINGS: Osseous: Poor dentition, extensive bilateral dental caries. Mandible remains intact and aligned. Bilateral maxilla, zygoma, pterygoid,  and nasal bones appear stable and intact. Intact central skull base. Cervical spine detailed separately. Orbits: No orbital wall fracture. Globes and intraorbital soft tissues appears symmetric and normal. Sinuses: New ethmoid predominant paranasal sinus mucosal thickening and trace new layering sinus fluid bilaterally. Tympanic cavities and mastoids remain clear. Soft tissues: Negative visible noncontrast larynx, pharynx, parapharyngeal spaces, retropharyngeal space, sublingual space, submandibular spaces, masticator and parotid spaces. Limited intracranial: Reported separately. IMPRESSION: 1. No acute traumatic injury identified in the Face. 2. Poor dentition. 3. Bilateral paranasal sinus  inflammation new from last month. Electronically Signed   By: VEAR Hurst M.D.   On: 01/25/2024 04:37   CT Head Wo Contrast Result Date: 01/25/2024 CLINICAL DATA:  48 year old male status post unwitnessed fall at work, posterior head bleeding. Seizure history. EXAM: CT HEAD WITHOUT CONTRAST TECHNIQUE: Contiguous axial images were obtained from the base of the skull through the vertex without intravenous contrast. RADIATION DOSE REDUCTION: This exam was performed according to the departmental dose-optimization program which includes automated exposure control, adjustment of the mA and/or kV according to patient size and/or use of iterative reconstruction technique. COMPARISON:  Head CT 01/04/2023. FINDINGS: Brain: Normal cerebral volume. No midline shift, ventriculomegaly, mass effect, evidence of mass lesion, intracranial hemorrhage or evidence of cortically based acute infarction. Gray-white matter differentiation is within normal limits throughout the brain. Simple appearing cystic change to the pineal gland with no regional mass effect, normal variant. Vascular: No suspicious intracranial vascular hyperdensity. Mild Calcified atherosclerosis at the skull base. Skull: Carious dentition, face CT reported separately today. Calvarium appears stable and intact. Sinuses/Orbits: New small fluid levels layering in the paranasal sinuses, although appears to be low-density, inflammatory. And there is mild new ethmoid and frontal sinus mucosal thickening. Tympanic cavities and mastoids remain clear. Other: Face CT today is reported separately. Broad-based posterior convexity scalp hematoma or contusion to the left of midline on series 4, image 49. No scalp soft tissue gas. Underlying calvarium appears intact. Discrete orbit or scalp soft tissue injury identified. IMPRESSION: 1. Left posterior scalp soft tissue injury. No underlying skull fracture. 2. Stable and normal noncontrast CT appearance of the brain. 3. New paranasal  sinus inflammation since last month. Carious dentition. Face CT reported separately. Electronically Signed   By: VEAR Hurst M.D.   On: 01/25/2024 04:33   DG Chest Port 1 View Result Date: 01/25/2024 CLINICAL DATA:  48 year old male status post unwitnessed fall at work. Altered mental status. EXAM: PORTABLE CHEST 1 VIEW COMPARISON:  None Available. FINDINGS: Portable AP semi upright view at 0407 hours. Low normal lung volumes. Normal cardiac size and mediastinal contours. Visualized tracheal air column is within normal limits. Allowing for portable technique the lungs are clear. No acute osseous abnormality identified. Negative visible bowel gas. IMPRESSION: Negative portable chest. Electronically Signed   By: VEAR Hurst M.D.   On: 01/25/2024 04:32    Procedures Procedures    Medications Ordered in ED Medications  acetaminophen  (TYLENOL ) tablet 1,000 mg (has no administration in time range)  levETIRAcetam  (KEPPRA ) IVPB 1500 mg/ 100 mL premix (has no administration in time range)  levETIRAcetam  (KEPPRA ) IVPB 1500 mg/ 100 mL premix (0 mg Intravenous Stopped 01/25/24 0503)    ED Course/ Medical Decision Making/ A&P                                 Medical Decision Making Amount and/or Complexity of Data Reviewed Labs: ordered. Radiology:  ordered.  Risk OTC drugs. Prescription drug management. Decision regarding hospitalization.   Patient with history of seizure disorder here for evaluation after being found down, does have evidence of trauma to his head.  Patient very confused and slow to answer questions at time of ED arrival.  Given evidence of head trauma CT head was obtained.  CT is negative for intracranial abnormality.  On reassessment patient continues to be confused, cannot fully answer questions.  He does report that he has a seizure disorder.  Unclear if there is been any missed doses.  Discussed with Dr. Voncile with neurology-who saw the patient in consult.  Plan to continue to observe in  the emergency department given his ongoing postictal state and possible concussion as well.  Patient care transferred pending reevaluation.        Final Clinical Impression(s) / ED Diagnoses Final diagnoses:  None    Rx / DC Orders ED Discharge Orders     None         Griselda Norris, MD 01/26/24 [phone]

## 2024-01-25 NOTE — ED Notes (Signed)
 Pt was getting a routine EEG when this RN was called to the room. Pt began to have a seizure, Neurology was at bedside and ordered 2mg  of Ativan . The EEG tech noticed patient was clinching his fist, and noticed something in his fist. Once we was hit fist open there was a plastic straw with a small pink pill instead. After talking to the ED provider and the pharmacist it was determined that the pill was 10mg  of Oxycodone . This RN disposed of the pill with Darice Rouleau RN as a witness. Pt was searched and clothing removed and set away from the patient's reach. We will continue to monitor.

## 2024-01-25 NOTE — ED Provider Notes (Signed)
 Pt signed out by Dr. Griselda pending symptomatic improvement.  Pt checked on multiple times.  He is still very postictal at 1230, so I spoke with Dr. Merrianne.  He ordered a stat EEG.  Pt's nurse found him with a oxy 10 on him.  She took it and disposed of it with security.  Dr. Merrianne said EEG showed status, so pt was given ativan  and cerebyx .   Pt d/w CCM for admission.   Dean Clarity, MD 01/25/24 1426

## 2024-01-25 NOTE — Progress Notes (Addendum)
 LTM EEG hooked up and running - no initial skin breakdown - push button tested - [facility] NOT monitoring. Pt is in ED GMD/MB

## 2024-01-25 NOTE — Procedures (Signed)
 Patient Name: Jason [name]  MRN: [id number]  Epilepsy Attending: Arlin MALVA Krebs  Referring Physician/Provider: Merrianne Locus, MD  Date: 01/25/2024 Duration: 21.48 mins  Patient history: 48yo M with h/o epilepsy getting eeg to evaluate for seizure   Level of alertness: Awake  AEDs during EEG study: LEV  Technical aspects: This EEG study was done with scalp electrodes positioned according to the 10-20 International system of electrode placement. Electrical activity was reviewed with band pass filter of 1-70Hz , sensitivity of 7 uV/mm, display speed of 70mm/sec with a 60Hz  notched filter applied as appropriate. EEG data were recorded continuously and digitally stored.  Video monitoring was available and reviewed as appropriate.  Description: EEG showed continuous generalized 3 to 6 Hz theta-delta slowing. Generalized and maximal bifrontal polyspikes were also noted, periodic at 1.5-2Hz , with overlying rhythmicity followed by 1-3 seconds of generalized attenuation. Npo clinical signs were noted. 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 Hyperventilation and photic stimulation were not performed.     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 Although EEG shows generalized onset, status epilepticus with frontal origin can heave similar appearance.  Additionally there was moderate diffuse encephalopathy.  Dr Merrianne was notified.  Tacey Dimaggio O Xavier Munger

## 2024-01-25 NOTE — H&P (Addendum)
 NAME:  Jason [name], MRN:  [id number], DOB:  Oct 22, 1976, LOS: 0 ADMISSION DATE:  01/25/2024, CONSULTATION DATE:  2/7 REFERRING MD:  dean, CHIEF COMPLAINT:  seizure    History of Present Illness:  48 year old male patient with known history of seizure disorder on Keppra  presented to the emergency room via EMS on 2/7 after being found at work after an unwitnessed fall.  On EMS arrival the patient had blood and bruising on his head with dried blood in his mouth, around his mouth, and his posterior head.  He was initially slow to respond,A CT head was obtained this was negative, C-spine was negative for acute traumatic injury, however there were layering secretions in the trachea and the left mainstem bronchus with findings worrisome in the left lower lobe for aspiration he also had bilateral paraspinous inflammation. He was seen by neurology once again for breakthrough seizure (had been seen 2 months prior for breakthrough seizures as well.  He was loaded with an additional 1500 mg of Keppra  for total of 3 g, and EEG monitoring and clinical monitoring for return to baseline was recommended. 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  He was administered additional Ativan  and fosphenytoin  critical care asked to admit as remained encephalopathic and would require LTM EEG   Of note just prior to placement on LTM was found by nursing staff with OxyContin  and hand and straw that was not prescribed  Pertinent  Medical History  Seizure disorder, polysubstance abuse Remote HSV encephalitis Significant Hospital Events: Including procedures, antibiotic start and stop dates in addition to other pertinent events   2/7 admitted status post seizure found to be in status on EEG loaded with additional fosphenytoin  in addition to benzodiazepine placed on LTM  Interim History / Subjective:  Lethargic but  will follow commands  Objective   Blood pressure 133/84, pulse 71, temperature (!) 97.5 F (36.4 C), temperature source Oral, resp. rate 16, height 5' 8 (1.727 m), weight 93 kg, SpO2 98%.        Intake/Output Summary (Last 24 hours) at 01/25/2024 1435 Last data filed at 01/25/2024 1121 Gross per 24 hour  Intake 1100 ml  Output 650 ml  Net 450 ml   Filed Weights   01/25/24 0354  Weight: 93 kg    Examination: General: 48 year old male patient resting in bed currently LTM in place  HENT: he has dried blood in his mouth, around his eyes, his posterior head and ears, pupils are equal and reactive Lungs: Some scattered rhonchi that improved with cough, chest x-ray was clear , However C-spine lung windows did show left basilar airspace disease as well as mucus within the airway Cardiovascular: Regular rate and rhythm Abdomen: Soft not tender Extremities: Warm dry Neuro: Opens eyes to command, moves all extremities with generalized weakness but no focal deficits attempts to verbalize but not understandable GU: Voids  Resolved Hospital Problem list     Assessment & Plan:  Acute metabolic and potentially toxic encephalopathy secondary to status epilepticus.  Also with concern about polysubstance abuse and medication compliance Initially thought postictal but EEG showing status.  Loaded with additional AEDs and benzodiazepines.  His UDS was positive for benzodiazepines and cocaine  Plan Admit to intensive care Continuous pulse oximetry Continuous LTM AEDs as directed by neurology.Already received Keppra  x 2, now on fosphenytoin , keppra  and Dilantin  with as needed benzodiazepines Seizure precautions N.p.o. Treat  fevers Keep euglycemic  Possible aspiration pneumonia Plan Monitor fever and white blood cell curve Check procalcitonin A.m. chest x-ray Low threshold to start empiric Unasyn   At risk for AKI Plan Checking total CKs Starting maintenance IV fluids And chemistry Best  Practice (right click and Reselect all SmartList Selections daily)   Diet/type: NPO DVT prophylaxis SCD Pressure ulcer(s): N/A GI prophylaxis: N/A Lines: N/A Foley:  N/A Code Status:  full code Last date of multidisciplinary goals of care discussion [pending]  Labs   CBC: Recent Labs  Lab 01/25/24 0357  WBC 3.4*  NEUTROABS 1.6*  HGB 12.6*  HCT 37.2*  MCV 85.5  PLT 212    Basic Metabolic Panel: Recent Labs  Lab 01/25/24 0357  NA 140  K 3.6  CL 106  CO2 24  GLUCOSE 101*  BUN 11  CREATININE 0.92  CALCIUM 8.7*   GFR: Estimated Creatinine Clearance: 109.8 mL/min (by C-G formula based on SCr of 0.92 mg/dL). Recent Labs  Lab 01/25/24 0357  WBC 3.4*    Liver Function Tests: Recent Labs  Lab 01/25/24 0357  AST 32  ALT 23  ALKPHOS 52  BILITOT 0.3  PROT 7.2  ALBUMIN 3.9   No results for input(s): LIPASE, AMYLASE in the last 168 hours. No results for input(s): AMMONIA in the last 168 hours.  ABG No results found for: PHART, PCO2ART, PO2ART, HCO3, TCO2, ACIDBASEDEF, O2SAT   Coagulation Profile: No results for input(s): INR, PROTIME in the last 168 hours.  Cardiac Enzymes: No results for input(s): CKTOTAL, CKMB, CKMBINDEX, TROPONINI in the last 168 hours.  HbA1C: No results found for: HGBA1C  CBG: No results for input(s): GLUCAP in the last 168 hours.  Review of Systems:   Not able   Past Medical History:  He,  has a past medical history of Seizures (HCC).   Surgical History:   Past Surgical History:  Procedure Laterality Date   TONSILLECTOMY       Social History:   reports that he has never smoked. He does not have any smokeless tobacco history on file. He reports that he does not drink alcohol and does not use drugs.   Family History:  His family history is not on file.   Allergies Allergies  Allergen Reactions   Hydrocodone-Acetaminophen  Nausea And Vomiting     Home Medications  Prior to  Admission medications   Medication Sig Start Date End Date Taking? Authorizing Provider  acetaminophen  (TYLENOL ) 500 MG tablet Take 1,000 mg by mouth every 6 (six) hours as needed for headache.   Yes [provider]  ibuprofen (ADVIL,MOTRIN) 200 MG tablet Take 400 mg by mouth every 6 (six) hours as needed for moderate pain.   Yes [provider]  levETIRAcetam  (KEPPRA ) 1000 MG tablet Take 2,000 mg by mouth 2 (two) times daily.   Yes [provider]  oxyCODONE -acetaminophen  (PERCOCET) 5-325 MG per tablet Take 1-2 tablets by mouth every 4 (four) hours as needed for severe pain. 08/08/14  Yes Carlyle Lenis, MD     Critical care time: 32 min

## 2024-01-25 NOTE — Progress Notes (Signed)
 EEG complete - results pending

## 2024-01-25 NOTE — Consult Note (Signed)
 NEUROLOGY CONSULT NOTE   Date of service: January 25, 2024 Patient Name: Jason [name] MRN:  [id number] DOB:  1976-06-04 Chief Complaint: Breakthrough seizure, prolonged postictal state Requesting Provider: Griselda Norris, MD  History of Present Illness  Jason [name] is a 48 y.o. 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  He has blood around his mouth and bruising on the head.  He was following commands on arrival but was drowsy.  He remained drowsy for over 2 hours after arrival in the ED provider requested consultation for further workup recommendations. Patient does not remember what happened.  He reports compliance to medications.  He is very slow to respond but he responds to all questions and was able to provide me his neurologist's name, which matches the name and Care Everywhere but unfortunately that facility does not share their detailed notes in Care Everywhere. He had been seen in January for a breakthrough seizure as well-which was deemed to be in the setting of poor sleep. In the ER he was given Keppra  1500 load.   ROS  Comprehensive ROS performed and pertinent positives documented in HPI   Past History   Past Medical History:  Diagnosis Date   Seizures (HCC)     Past Surgical History:  Procedure Laterality Date   TONSILLECTOMY      Family History: History reviewed. No pertinent family history.  Social History  reports that he has never smoked. He does not have any smokeless tobacco history on file. He reports that he does not drink alcohol and does not use drugs.  No Known Allergies  Medications   Current Facility-Administered Medications:    acetaminophen  (TYLENOL ) tablet 1,000 mg, 1,000 mg, Oral, Once, Griselda Norris, MD   levETIRAcetam  (KEPPRA ) IVPB 1500 mg/ 100 mL premix, 1,500 mg, Intravenous,  Once, Griselda Norris, MD  Current Outpatient Medications:    acetaminophen  (TYLENOL ) 500 MG tablet, Take 1,000 mg by mouth every 6 (six) hours as needed for headache., Disp: , Rfl:    cephALEXin  (KEFLEX ) 500 MG capsule, Take 1 capsule (500 mg total) by mouth 4 (four) times daily., Disp: 28 capsule, Rfl: 0   ibuprofen (ADVIL,MOTRIN) 200 MG tablet, Take 400 mg by mouth every 6 (six) hours as needed for moderate pain., Disp: , Rfl:    levETIRAcetam  (KEPPRA ) 750 MG tablet, Take 1.5 tablets by mouth 2 (two) times daily., Disp: , Rfl:    Multiple Vitamin (MULTIVITAMIN WITH MINERALS) TABS tablet, Take 1 tablet by mouth daily., Disp: , Rfl:    ondansetron  (ZOFRAN  ODT) 4 MG disintegrating tablet, 4mg  ODT q4 hours prn nausea/vomit, Disp: 8 tablet, Rfl: 0   oxyCODONE -acetaminophen  (PERCOCET) 5-325 MG per tablet, Take 1-2 tablets by mouth every 4 (four) hours as needed for severe pain., Disp: 20 tablet, Rfl: 0  Vitals   Vitals:   01/25/24 0430 01/25/24 0500 01/25/24 0530 01/25/24 0600  BP: 124/87 123/86 123/88 (!) 118/94  Pulse: 74 70 64 63  Resp: 12 17 (!) 9 13  Temp:      TempSrc:      SpO2: 98% 97% 97% 95%  Weight:      Height:        Body mass index is 31.17 kg/m.  Physical Exam  General: Well-developed well-nourished man, sleeping in bed HEENT: Dried blood around the mouth, left posterior scalp injury with  dried blood around the forehead and ears as well.  No neck stiffness. CVS: Regular rate rhythm Respiratory: Breathing well saturating normally on room air Abdomen nondistended nontender Neurological exam Sleeping in bed Awakens to voice Slow to respond to questions No evidence of aphasia Poor attention concentration Mildly dysarthric speech Cranial nerves II to XII intact Motor examination with no drift in any of the 4 extremities Sensation intact to light touch in all fours No evidence of dysmetria  Labs/Imaging/Neurodiagnostic studies   CBC:  Recent Labs  Lab  01/25/24 0357  WBC 3.4*  NEUTROABS 1.6*  HGB 12.6*  HCT 37.2*  MCV 85.5  PLT 212   Basic Metabolic Panel:  Lab Results  Component Value Date   NA 140 01/25/2024   K 3.6 01/25/2024   CO2 24 01/25/2024   GLUCOSE 101 (H) 01/25/2024   BUN 11 01/25/2024   CREATININE 0.92 01/25/2024   CALCIUM 8.7 (L) 01/25/2024   GFRNONAA >60 01/25/2024   GFRAA  07/14/2007    >60        The eGFR has been calculated using the MDRD equation. This calculation has not been validated in all clinical    Urine Drug Screen:     Component Value Date/Time   LABOPIA NONE DETECTED 07/13/2007 1732   COCAINSCRNUR NONE DETECTED 07/13/2007 1732   LABBENZ NONE DETECTED 07/13/2007 1732   AMPHETMU NONE DETECTED 07/13/2007 1732   THCU NONE DETECTED 07/13/2007 1732   LABBARB  07/13/2007 1732    NONE DETECTED        DRUG SCREEN FOR MEDICAL PURPOSES ONLY.  IF CONFIRMATION IS NEEDED FOR ANY PURPOSE, NOTIFY LAB WITHIN 5 DAYS.    Alcohol Level     Component Value Date/Time   ETH <10 01/25/2024 0410    CT Head without contrast(Personally reviewed): No acute changes  CT C-spine and CT maxillofacial-no acute traumatic injury to the C-spine.  Age-indeterminate mild T4 compression fracture.  Layering secretions in the trachea and left mainstem bronchus and mild tree-in-bud nodular opacity in the superior segment of the left lower lobe-suspicious for mild aspiration and distal airway inflammation.  No acute injury identified in the face.  Poor dentition.  Bilateral paranasal sinus inflammation from last month  ASSESSMENT   Jason [name] is a 48 y.o. male past medical history of seizures and prior history of viral encephalitis in 2001, currently on Keppra , brought in from work for presumed unwitnessed fall and facial trauma. Patient does not remember the sequence of events Has been very drowsy and slow to come to baseline, for which neurological consultation was obtained Initial examination by the ED  provider-she was barely able to say his name and a few words.  On my examination, he was able to provide me the name of his neurologist, was able to follow commands and repeat sentences although had poor attention concentration. Overall examination is consistent with encephalopathy which is likely secondary to the face and head injury as well as breakthrough seizure-postictal lethargy.   RECOMMENDATIONS  Given that this is a second visit in about 2 months for breakthrough seizures, I would increase his home Keppra  dose to 1000 twice daily from 750 twice daily. I recommend an additional load of 1500 mg IV x 1 for a total load of 3000 mg Keppra  in the ED. Maintain seizure precautions I would recommend observing him for the next several hours.  If he does not return to baseline, consider obtaining an EEG and also MRI of the brain with  and without contrast. If he is back to his baseline in the next several hours, then he can be discharged home with outpatient neurology follow-up since he already has an outpatient neurologist. I will check a toxicology screen as well. Plan was discussed with Dr. Griselda, EDP  ______________________________________________________________________    Signed, Eligio Lav, MD [facility]

## 2024-01-25 NOTE — Progress Notes (Signed)
 S: Called to the bedside after LTM EEG monitor showed status epilepticus.   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 Able to state his name. Cannot state the month or the day of the week. One word answers to questions only. Severely bradyphrenic.  CN: Blank stare. PERRL. Eyes conjugate at the midline. Face is symmetric but expressionless.  Motor: Will move BUE slowly to command antigravity. Grips weakly with both hands  About 2 minutes after neurological exam was started, the patient started to exhibit generalized seizure activity, initially with a surprised expression on his face, mouth wide open, arms held antigravity above torso and extended, flexed at torso, followed by generalized shaking of BUE.  STAT Ativan  2 mg IV was administered with resolution of clinical seizure activity.   Assessment/Recommendations: 48 year old male with a history of HSV encephalitis and seizures, on Keppra  at home who presents to the ED after being found down following a presumed unwitnessed fall. He had no memory of the event. He had been seen in January for a breakthrough seizure in January as well. In the ED he was given a total Keppra  load of 3000 mg and his scheduled home dose was increased to 750 mg BID (from 500 mg BID). Subsequently seized again in the ED after placement of LTM EEG leads.  - Ativan  2 mg IV given STAT with resolution of clinical seizure activity - LTM EEG after Ativan  shows diffuse slowing with resolution of electrographic seizure activity - Loading with fosphenytoin  20 mg PE/kg followed by scheduled Dilantin  100 mg IV TID.  - Dilantin  level tomorrow morning - Mg level - Of note, UDS was positive for cocaine  and a 10 mg pill of oxy was found  gripped in his right hand after onset of the seizure described above - Continue Keppra  at 750 mg IV BID - Ativan  2 mg PRN seizure recurrence and call neurology - Inpatient seizure precautions - Will need to discuss outpatient seizure precautions when the patient is back to baseline.  - Continue LTM EEG - Being admitted to the ICU  40 minutes spent in the emergent neurological evaluation and management of this critically ill patient. Time spent included coordination of care and EEG review.   Electronically signed: Dr. Ebony Yorio

## 2024-01-26 ENCOUNTER — Inpatient Hospital Stay (HOSPITAL_COMMUNITY): Payer: Medicaid Other

## 2024-01-26 DIAGNOSIS — G40901 Epilepsy, unspecified, not intractable, with status epilepticus: Secondary | ICD-10-CM | POA: Diagnosis not present

## 2024-01-26 DIAGNOSIS — F1910 Other psychoactive substance abuse, uncomplicated: Secondary | ICD-10-CM | POA: Diagnosis not present

## 2024-01-26 DIAGNOSIS — R569 Unspecified convulsions: Principal | ICD-10-CM

## 2024-01-26 LAB — GLUCOSE, CAPILLARY
Glucose-Capillary: 100 mg/dL — ABNORMAL HIGH (ref 70–99)
Glucose-Capillary: 74 mg/dL (ref 70–99)
Glucose-Capillary: 79 mg/dL (ref 70–99)
Glucose-Capillary: 79 mg/dL (ref 70–99)
Glucose-Capillary: 82 mg/dL (ref 70–99)
Glucose-Capillary: 82 mg/dL (ref 70–99)

## 2024-01-26 LAB — CBC
HCT: 36.1 % — ABNORMAL LOW (ref 39.0–52.0)
Hemoglobin: 12.3 g/dL — ABNORMAL LOW (ref 13.0–17.0)
MCH: 29.1 pg (ref 26.0–34.0)
MCHC: 34.1 g/dL (ref 30.0–36.0)
MCV: 85.5 fL (ref 80.0–100.0)
Platelets: 189 10*3/uL (ref 150–400)
RBC: 4.22 MIL/uL (ref 4.22–5.81)
RDW: 12.8 % (ref 11.5–15.5)
WBC: 5.8 10*3/uL (ref 4.0–10.5)
nRBC: 0 % (ref 0.0–0.2)

## 2024-01-26 LAB — BASIC METABOLIC PANEL
Anion gap: 13 (ref 5–15)
BUN: 7 mg/dL (ref 6–20)
CO2: 23 mmol/L (ref 22–32)
Calcium: 8.7 mg/dL — ABNORMAL LOW (ref 8.9–10.3)
Chloride: 106 mmol/L (ref 98–111)
Creatinine, Ser: 0.79 mg/dL (ref 0.61–1.24)
GFR, Estimated: >60 mL/min (ref >60)
Glucose, Bld: 90 mg/dL (ref 70–99)
Potassium: 3.7 mmol/L (ref 3.5–5.1)
Sodium: 142 mmol/L (ref 135–145)

## 2024-01-26 LAB — PROCALCITONIN: Procalcitonin: <0.1 ng/mL

## 2024-01-26 MED ORDER — GUAIFENESIN 100 MG/5ML PO LIQD
5.0000 mL | ORAL | Status: DC | PRN
  Administered 2024-01-26 – 2024-01-28 (×5): 5 mL via ORAL
  Filled 2024-01-26 (×2): qty 10
  Filled 2024-01-26 (×2): qty 15
  Filled 2024-01-26: qty 10

## 2024-01-26 MED ORDER — PHENYTOIN SODIUM EXTENDED 100 MG PO CAPS
100.0000 mg | ORAL_CAPSULE | Freq: Three times a day (TID) | ORAL | Status: DC
  Administered 2024-01-26: 100 mg via ORAL
  Filled 2024-01-26 (×2): qty 1

## 2024-01-26 MED ORDER — ENOXAPARIN SODIUM 40 MG/0.4ML IJ SOSY
40.0000 mg | PREFILLED_SYRINGE | INTRAMUSCULAR | Status: DC
  Administered 2024-01-26 – 2024-01-28 (×3): 40 mg via SUBCUTANEOUS
  Filled 2024-01-26 (×3): qty 0.4

## 2024-01-26 MED ORDER — OXYCODONE-ACETAMINOPHEN 5-325 MG PO TABS
1.0000 | ORAL_TABLET | ORAL | Status: DC | PRN
  Administered 2024-01-26 – 2024-01-27 (×5): 1 via ORAL
  Administered 2024-01-27: 2 via ORAL
  Filled 2024-01-26: qty 2
  Filled 2024-01-26 (×5): qty 1

## 2024-01-26 MED ORDER — SODIUM CHLORIDE 0.9 % IV SOLN
2000.0000 mg | Freq: Two times a day (BID) | INTRAVENOUS | Status: DC
  Administered 2024-01-26 – 2024-01-28 (×5): 2000 mg via INTRAVENOUS
  Filled 2024-01-26 (×6): qty 20

## 2024-01-26 MED ORDER — POTASSIUM CHLORIDE 10 MEQ/100ML IV SOLN
10.0000 meq | INTRAVENOUS | Status: AC
  Administered 2024-01-26 (×4): 10 meq via INTRAVENOUS
  Filled 2024-01-26 (×4): qty 100

## 2024-01-26 NOTE — Progress Notes (Signed)
 NAME:  Jason [name], MRN:  [id number], DOB:  May 31, 1976, LOS: 1 ADMISSION DATE:  01/25/2024, CONSULTATION DATE:  2/7 REFERRING MD:  dean, CHIEF COMPLAINT:  seizure    History of Present Illness:  48 year old male patient with known history of seizure disorder on Keppra  presented to the emergency room via EMS on 2/7 after being found at work after an unwitnessed fall.  On EMS arrival the patient had blood and bruising on his head with dried blood in his mouth, around his mouth, and his posterior head.  He was initially slow to respond,A CT head was obtained this was negative, C-spine was negative for acute traumatic injury, however there were layering secretions in the trachea and the left mainstem bronchus with findings worrisome in the left lower lobe for aspiration he also had bilateral paraspinous inflammation. He was seen by neurology once again for breakthrough seizure (had been seen 2 months prior for breakthrough seizures as well.  He was loaded with an additional 1500 mg of Keppra  for total of 3 g, and EEG monitoring and clinical monitoring for return to baseline was recommended. 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  He was administered additional Ativan  and fosphenytoin  critical care asked to admit as remained encephalopathic and would require LTM EEG   Of note just prior to placement on LTM was found by nursing staff with OxyContin  and hand and straw that was not prescribed  Pertinent  Medical History  Seizure disorder, polysubstance abuse Remote HSV encephalitis Significant Hospital Events: Including procedures, antibiotic start and stop dates in addition to other pertinent events   2/7 admitted status post seizure found to be in status on EEG loaded with additional fosphenytoin  in addition to benzodiazepine placed on LTM 2/8 no further seizures since lorazepam  in ED. EEG  shows consistent improvement.  Interim History / Subjective:  Sleeping but able to converse and maintain attention.  Complains of generalized pain. Passed swallow evaluation.   Objective   Blood pressure 132/86, pulse 86, temperature 98 F (36.7 C), temperature source Oral, resp. rate 14, height 5' 8 (1.727 m), weight 83.3 kg, SpO2 96%.        Intake/Output Summary (Last 24 hours) at 01/26/2024 0912 Last data filed at 01/26/2024 0700 Gross per 24 hour  Intake 2319.34 ml  Output 1550 ml  Net 769.34 ml   Filed Weights   01/25/24 0354 01/26/24 0500  Weight: 93 kg 83.3 kg    Examination: General: 48 year old male patient resting in bed currently LTM in place  HENT: dried blood on face. EEG leads in place.  Lungs:  clear Cardiovascular: Regular rate and rhythm Abdomen: Soft not tender Extremities: Warm dry Neuro: Opens eyes to command, moves all extremities with generalized weakness but no focal deficits. Conversant in low voice.  GU: Voids  Ancillary Tests Personally Reviewed:    Normal electrolytes and mild anemia.  Assessment & Plan:  Status epilepticus on background of known seizure disorder.   Plan:   - Advance diet and progressive ambulation - Can move to PCU. - Increase Keppra  to home dose. May need few days of medication optimization.  Patient's lamotrigine was stopped 2 years ago.   Best Practice (right click and Reselect all SmartList Selections daily)   Diet/type: regular diet.  DVT prophylaxis SCD Pressure ulcer(s): N/A GI prophylaxis: N/A Lines: N/A Foley:  N/A Code Status:  full code Last  date of multidisciplinary goals of care discussion [patient updated at bedside.]  Fredia Alderton, MD [facility] ICU Physician [facility]  Pager: [phone] Or Epic Secure Chat After hours: [phone].  01/26/2024, 9:19 AM

## 2024-01-26 NOTE — Progress Notes (Signed)
 [facility] ADULT ICU REPLACEMENT PROTOCOL   The patient does apply for the [facility] Adult ICU Electrolyte Replacment Protocol based on the criteria listed below:   1.Exclusion criteria: TCTS, ECMO, Dialysis, and Myasthenia Gravis patients 2. Is GFR >/= 30 ml/min? Yes.    Patient's GFR today is >60 3. Is SCr </= 2? Yes.   Patient's SCr is 0.79 mg/dL 4. Did SCr increase >/= 0.5 in 24 hours? No. 5.Pt's weight >40kg  Yes.   6. Abnormal electrolyte(s): K+ = 3.7  7. Electrolytes replaced per protocol 8.  Call MD STAT for K+ </= 2.5, Phos </= 1, or Mag </= 1 Physician:  Epimenio, eMD  Rosina LOISE Hamilton 01/26/2024 5:40 AM

## 2024-01-26 NOTE — Progress Notes (Signed)
 NEUROLOGY CONSULT FOLLOW UP NOTE   Date of service: January 26, 2024 Patient Name: Jason [name] MRN:  [id number] DOB:  1976/07/07  Interval Hx/subjective   Patient is awake and alert in NAD. Family is asleep in the chair. RN is at the bedside. He is on LTM. No new neurological events overnight.   Vitals   Vitals:   01/26/24 0600 01/26/24 0700 01/26/24 0800 01/26/24 0900  BP: 107/81 120/80 (!) 131/92 132/86  Pulse: 62 80 92 86  Resp: 12 15 14    Temp:   98 F (36.7 C)   TempSrc:   Oral   SpO2: 95% 94% 95% 96%  Weight:      Height:         Body mass index is 27.92 kg/m.  Physical Exam   Constitutional: Appears well-developed and well-nourished.  Psych: Affect appropriate to situation.  Eyes: No scleral injection.  HENT: No OP obstrucion.  Head: Normocephalic.  Cardiovascular: Normal rate and regular rhythm.  Respiratory: Effort normal, non-labored breathing.  GI: Soft.  No distension. There is no tenderness.  Skin: WDI.   Neurologic Examination  Mental Status -  Level of arousal and orientation to time, place and person were intact. Language including expression, naming, repetition, comprehension was assessed and found intact. Attention span and concentration were normal.  Cranial Nerves II - XII - II - Visual field intact OU. III, IV, VI - Extraocular movements intact. V - Facial sensation intact bilaterally. VII - Facial movement intact bilaterally. VIII - Hearing & vestibular intact bilaterally. X - Palate elevates symmetrically. XI - Chin turning & shoulder shrug intact bilaterally. XII - Tongue protrusion intact.  Motor Strength - The patient's strength was normal in all extremities and pronator drift was absent.  Bulk was normal and fasciculations were absent.   Motor Tone - Muscle tone was assessed at the neck and appendages and was normal. Sensory - Light touch, temperature/pinprick were assessed and were symmetrical.   Coordination - The patient had  normal movements in the hands and feet with no ataxia or dysmetria.  Tremor was absent. Gait and Station - Deferred.  Medications  Current Facility-Administered Medications:    0.9 %  sodium chloride  infusion, , Intravenous, Continuous, Jenna Maude BRAVO, NP, Last Rate: 75 mL/hr at 01/26/24 0700, Infusion Verify at 01/26/24 0700   Chlorhexidine  Gluconate Cloth 2 % PADS 6 each, 6 each, Topical, Daily, Agarwala, Ravi, MD, 6 each at 01/25/24 1700   enoxaparin  (LOVENOX ) injection 40 mg, 40 mg, Subcutaneous, Q24H, Agarwala, Ravi, MD   levETIRAcetam  (KEPPRA ) 2,000 mg in sodium chloride  0.9 % 250 mL IVPB, 2,000 mg, Intravenous, Q12H, Waddell, Denise A, NP   LORazepam  (ATIVAN ) injection 2 mg, 2 mg, Intravenous, Q1H PRN, Agarwala, Ravi, MD   Oral care mouth rinse, 15 mL, Mouth Rinse, PRN, Agarwala, Ravi, MD   Oral care mouth rinse, 15 mL, Mouth Rinse, 4 times per day, Arlinda Ranks, MD, 15 mL at 01/25/24 2105   oxyCODONE -acetaminophen  (PERCOCET/ROXICET) 5-325 MG per tablet 1-2 tablet, 1-2 tablet, Oral, Q3H PRN, Agarwala, Ravi, MD   phenytoin  (DILANTIN ) ER capsule 100 mg, 100 mg, Oral, TID, Agarwala, Ravi, MD   polyethylene glycol (MIRALAX  / GLYCOLAX ) packet 17 g, 17 g, Oral, Daily PRN, Babcock, Peter E, NP   potassium chloride  10 mEq in 100 mL IVPB, 10 mEq, Intravenous, Q1 Hr x 4, Ogan, [place]wo U, MD, Last Rate: 100 mL/hr at 01/26/24 0907, 10 mEq at 01/26/24 9092  Labs and Diagnostic Imaging  CBC:  Recent Labs  Lab 01/25/24 0357 01/26/24 0412  WBC 3.4* 5.8  NEUTROABS 1.6*  --   HGB 12.6* 12.3*  HCT 37.2* 36.1*  MCV 85.5 85.5  PLT 212 189    Basic Metabolic Panel:  Lab Results  Component Value Date   NA 142 01/26/2024   K 3.7 01/26/2024   CO2 23 01/26/2024   GLUCOSE 90 01/26/2024   BUN 7 01/26/2024   CREATININE 0.79 01/26/2024   CALCIUM 8.7 (L) 01/26/2024   GFRNONAA >60 01/26/2024   GFRAA  07/14/2007    >60        The eGFR has been calculated using the MDRD equation. This  calculation has not been validated in all clinical   Lipid Panel: No results found for: LDLCALC HgbA1c: No results found for: HGBA1C Urine Drug Screen:     Component Value Date/Time   LABOPIA POSITIVE (A) 01/25/2024 1120   COCAINSCRNUR POSITIVE (A) 01/25/2024 1120   LABBENZ NONE DETECTED 01/25/2024 1120   AMPHETMU NONE DETECTED 01/25/2024 1120   THCU NONE DETECTED 01/25/2024 1120   LABBARB NONE DETECTED 01/25/2024 1120    Alcohol Level     Component Value Date/Time   ETH <10 01/25/2024 0410   INR No results found for: INR APTT No results found for: APTT AED levels:  Lab Results  Component Value Date   PHENYTOIN  10.7 07/14/2007    CT Head without contrast (Personally reviewed): No intracranial abnormality                                             Spot EEG (2/7): This study showed evidence of electrographic status epilepticus. Although EEG shows generalized onset, status epilepticus with frontal origin can have a similar appearance. Additionally, there was moderate diffuse encephalopathy.  LTM EEG (2/7-2/8): This study initially showed evidence of electrographic status epilepticus. Although EEG shows generalized onset, status epilepticus with frontal origin can have a similar appearance. One generalized tonic clonic seizure was also noted on 01/25/2024 at 1413, lasting for about 1 minute, unclear onset. Additionally there was moderate diffuse encephalopathy. IV ativan  was administered on 01/25/2024 at 1413. 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 Focal epilepsy with frontal onset can have similar appearance.   Assessment  48 y.o. male with a history of HSV encephalitis and seizures, on Keppra  at home who presents to the ED after being found down following a presumed unwitnessed fall. He had no memory of the event. He had been seen in January for a breakthrough seizure in January as well. In the ED he was given a  total Keppra  load of 3000 mg and his scheduled home dose was increased to 750 mg BID (from 500 mg BID). Subsequently seized again in the ED after placement of LTM EEG leads. He was then administered Ativan  and loaded with fosphenytoin . Seizure activity then abated clinically and on EEG.  - On exam today he is awake and alert, oriented, with normal speech and able to follow all commands. No clinical seizure activity seen.  - LTM EEG revealed an electrographic seizure at 1413 yesterday (this had a clinical correlate of overt seizure activity as documented in the follow up note from yesterday). Subsequently, EEG improved and showed posterior dominant rhythm consisting of 8 Hz activity of moderate voltage (25-35 uV) seen predominantly in posterior head regions,  symmetric and reactive to eye opening and eye closing. Sleep was characterized by vertex waves, sleep spindles (12 to 14 Hz), maximal frontocentral region. Generalized maximal bifrontal polyspikes were noted.   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 Use caution when using heavy equipment or power tools. Avoid working on ladders or at heights. Take showers instead of baths. Ensure the water temperature is not too high on the home water heater. Do not go swimming alone. When caring for infants or small children, sit down when holding, feeding, or changing them to minimize risk of injury to the child in the event you have a seizure. Also, Maintain good sleep hygiene. Avoid  alcohol. - Substance abuse counseling to include discussion of sympathomimetics such as cocaine , which lower the seizure threshold.  - Neurohospitalist service will sign off. Please call if there are additional questions.   ______________________________________________________________________   Karna Geralds DNP, ACNPC-AG  [facility]  Electronically signed: Dr. Caeli Linehan

## 2024-01-26 NOTE — Progress Notes (Signed)
 LTM maint complete - no skin breakdown seen Serviced O2, F8 and loosen hairnet. [facility] monitored, Event button test confirmed by [facility].

## 2024-01-26 NOTE — Progress Notes (Signed)
MB performed maintenance on electrodes. All impedances are below 10k ohms. No skin breakdown noted.  

## 2024-01-26 NOTE — Procedures (Addendum)
 Patient Name: Jason [name]  MRN: [id number]  Epilepsy Attending: Arlin MALVA Krebs  Referring Physician/Provider: Merrianne Locus, MD  Duration: 01/25/2024 1631 to 01/26/2024 1631   Patient history: 48yo M with h/o epilepsy getting eeg to evaluate for seizure    Level of alertness: Awake, asleep   AEDs during EEG study: LEV   Technical aspects: This EEG study was done with scalp electrodes positioned according to the 10-20 International system of electrode placement. Electrical activity was reviewed with band pass filter of 1-70Hz , sensitivity of 7 uV/mm, display speed of 47mm/sec with a 60Hz  notched filter applied as appropriate. EEG data were recorded continuously and digitally stored.  Video monitoring was available and reviewed as appropriate.   Description: EEG showed continuous generalized 3 to 6 Hz theta-delta slowing. Generalized and maximal bifrontal polyspikes were also noted, periodic at 1.5-2Hz , with overlying rhythmicity followed by 1-3 seconds of generalized attenuation. Npo clinical signs were noted. 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   At 1409 on 01/25/2024, patient head forced head deviation to left followed by stiffening and extension of bilateral upper extremities which then evolved into generalized tonic -clonic seizure. EEG showed generalized and maximal frontal spikes. Subsequently, EEG was difficult to interpret due to significant myogenic artifact. Seizure lasted for about 1 minute. EEG then showed generalized attenuation.  IV ativan  was administered on 01/25/2024 at 1413. Subsequently, EEG improved and showed posterior dominant rhythm consists of 8 Hz activity of moderate voltage (25-35 uV) seen predominantly in posterior head regions, symmetric and reactive to eye opening and eye closing. Sleep was characterized by vertex waves, sleep spindles (12 to 14 Hz), maximal frontocentral region.  Generalized maximal bifrontal  polyspikes were noted.  ABNORMALITY - Electrographic status epilepticus, generalized vs frontal - Continuous slow, generalized - Polyspikes, generalized and maximal bifrontal   IMPRESSION: This study initially showed evidence of electrographic status epilepticus. Although EEG shows generalized onset, status epilepticus with frontal origin can heave similar appearance. One generalized tonic clonic seizure was also noted on 01/25/2024 at 1413, lasting for about 1 minute, unclear onset. Additionally there was moderate diffuse encephalopathy.  IV ativan  was administered on 01/25/2024 at 1413. 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 Focal epilepsy with frontal onset can have similar appearance.         Derotha Fishbaugh O Carol Theys

## 2024-01-26 NOTE — Procedures (Signed)
 Patient Name: Jason [name]  MRN: [id number]  Epilepsy Attending: Arlin MALVA Krebs  Referring Physician/Provider: Merrianne Locus, MD  Duration: 01/26/2024 1631 to 01/26/2024 1902   Patient history: 48yo M with h/o epilepsy getting eeg to evaluate for seizure    Level of alertness: Awake, asleep   AEDs during EEG study: LEV   Technical aspects: This EEG study was done with scalp electrodes positioned according to the 10-20 International system of electrode placement. Electrical activity was reviewed with band pass filter of 1-70Hz , sensitivity of 7 uV/mm, display speed of 12mm/sec with a 60Hz  notched filter applied as appropriate. EEG data were recorded continuously and digitally stored.  Video monitoring was available and reviewed as appropriate.   Description: EEG showed posterior dominant rhythm of 8 Hz activity of moderate voltage (25-35 uV) seen predominantly in posterior head regions, symmetric and reactive to eye opening and eye closing. Sleep was characterized by vertex waves, sleep spindles (12 to 14 Hz), maximal frontocentral region.  Generalized maximal bifrontal polyspikes were noted.   ABNORMALITY - Polyspikes, generalized and maximal bifrontal   IMPRESSION: This study was consistent with patient's history of epilepsy with generalized onset. Focal epilepsy with frontal onset can have similar appearance. No seizures were noted.  Tadhg Eskew O Chasitee Zenker

## 2024-01-26 NOTE — Progress Notes (Signed)
 LTM EEG discontinued - no skin breakdown at [facility].

## 2024-01-27 DIAGNOSIS — G40901 Epilepsy, unspecified, not intractable, with status epilepticus: Secondary | ICD-10-CM | POA: Diagnosis not present

## 2024-01-27 LAB — GLUCOSE, CAPILLARY
Glucose-Capillary: 117 mg/dL — ABNORMAL HIGH (ref 70–99)
Glucose-Capillary: 123 mg/dL — ABNORMAL HIGH (ref 70–99)
Glucose-Capillary: 133 mg/dL — ABNORMAL HIGH (ref 70–99)
Glucose-Capillary: 95 mg/dL (ref 70–99)
Glucose-Capillary: 97 mg/dL (ref 70–99)
Glucose-Capillary: 151 mg/dL — ABNORMAL HIGH (ref 70–99)

## 2024-01-27 LAB — PROCALCITONIN: Procalcitonin: <0.1 ng/mL

## 2024-01-27 MED ORDER — OXYCODONE-ACETAMINOPHEN 5-325 MG PO TABS
1.0000 | ORAL_TABLET | Freq: Four times a day (QID) | ORAL | Status: DC | PRN
  Administered 2024-01-27 – 2024-01-28 (×3): 1 via ORAL
  Filled 2024-01-27 (×3): qty 1

## 2024-01-27 MED ORDER — BENZONATATE 100 MG PO CAPS
100.0000 mg | ORAL_CAPSULE | Freq: Once | ORAL | Status: AC
  Administered 2024-01-27: 100 mg via ORAL
  Filled 2024-01-27: qty 1

## 2024-01-27 MED ORDER — BENZOCAINE 10 % MT GEL
Freq: Three times a day (TID) | OROMUCOSAL | Status: DC | PRN
  Filled 2024-01-27: qty 9

## 2024-01-27 NOTE — Evaluation (Signed)
 Occupational Therapy Evaluation Patient Details Name: Jason [name] MRN: [id number] DOB: [birth year] Today's Date: 01/27/2024   History of Present Illness 48 yo male s/p fall at work unresponsive with blood around mouth. In ED witnessed GTC seizure PMH epilepsy on keppra , polysubstance abuse, remote HSV encephalitis   Clinical Impression   Patient evaluated by Occupational Therapy with no further acute OT needs identified. All education has been completed and the patient has no further questions. See below for any follow-up Occupational Therapy or equipment needs. OT to sign off. Thank you for referral.         If plan is discharge home, recommend the following:      Functional Status Assessment     Equipment Recommendations  None recommended by OT    Recommendations for Other Services       Precautions / Restrictions Precautions Precautions: Fall Precaution Comments: seizure Restrictions Weight Bearing Restrictions Per Provider Order: No      Mobility Bed Mobility Overal bed mobility: Independent                  Transfers Overall transfer level: Modified independent                        Balance Overall balance assessment: Mild deficits observed, not formally tested (no glasses present per pt and girlfriend)                                         ADL either performed or assessed with clinical judgement   ADL Overall ADL's : Modified independent                                       General ADL Comments: completed sink level grooming, household distance transfer, See PT notes for steps, (no teeth or dentures)     Vision Baseline Vision/History: 1 Wears glasses Ability to See in Adequate Light: 2 Moderately impaired Patient Visual Report: Blurring of vision Vision Assessment?: Vision impaired- to be further tested in functional context (wears all the time)     Perception         Praxis          Pertinent Vitals/Pain Pain Assessment Pain Assessment: 0-10 Pain Score: 7  Pain Location: head,my whole body is sore Pain Descriptors / Indicators: Discomfort, Sore (reports chronic pain) Pain Intervention(s): Monitored during session, Premedicated before session, Repositioned     Extremity/Trunk Assessment Upper Extremity Assessment Upper Extremity Assessment: Overall WFL for tasks assessed (is showing some favoring to the L side but [facility] shoulder flexion)   Lower Extremity Assessment Lower Extremity Assessment: Overall WFL for tasks assessed   Cervical / Trunk Assessment Cervical / Trunk Assessment: Normal   Communication Communication Communication: No apparent difficulties   Cognition Arousal: Alert Behavior During Therapy: WFL for tasks assessed/performed Overall Cognitive Status: Within Functional Limits for tasks assessed                                       General Comments  BP 83/50 MAP 60  sitting 121/84 (94)    Exercises     Shoulder Instructions      Home Living Family/patient expects to be  discharged to:: Private residence Living Arrangements: Spouse/significant other;Parent Available Help at Discharge: Family Type of Home: Apartment Home Access: Stairs to enter [employer] of Steps: 18 (interior steps) Entrance Stairs-Rails: Right Home Layout: One level     Bathroom Shower/Tub: Chief Strategy Officer: Standard     Home Equipment: None          Prior Functioning/Environment Prior Level of Function : Independent/Modified Independent;Working/employed;Driving (does not drive)                        OT Problem List:        OT Treatment/Interventions:      OT Goals(Current goals can be found in the care plan section) Acute Rehab OT Goals Patient Stated Goal: to sleep  OT Frequency:      Co-evaluation              AM-PAC OT 6 Clicks Daily Activity     Outcome Measure Help from  another person eating meals?: None Help from another person taking care of personal grooming?: None Help from another person toileting, which includes using toliet, bedpan, or urinal?: None Help from another person bathing (including washing, rinsing, drying)?: None Help from another person to put on and taking off regular upper body clothing?: None Help from another person to put on and taking off regular lower body clothing?: None 6 Click Score: 24   End of Session Equipment Utilized During Treatment: Gait belt Nurse Communication: Mobility status  Activity Tolerance: Patient tolerated treatment well Patient left: in bed;with call bell/phone within reach;with bed alarm set;with family/visitor present (spouse asleep in chair)  OT Visit Diagnosis: Unsteadiness on feet (R26.81)                Time: 9183-9164 OT Time Calculation (min): 19 min Charges:  OT General Charges $OT Visit: 1 Visit OT Evaluation $OT Eval Low Complexity: 1 Low   Brynn, OTR/L  Acute Rehabilitation Services Office: [phone] .   Ely Molt 01/27/2024, 8:45 AM

## 2024-01-27 NOTE — Evaluation (Signed)
 Physical Therapy Evaluation Patient Details Name: Jason [name] MRN: [id number] DOB: [birth year] Today's Date: 01/27/2024  History of Present Illness  48 yo male s/p fall at work unresponsive with blood around mouth. In ED witnessed GTC seizure PMH epilepsy on keppra , polysubstance abuse, remote HSV encephalitis   Clinical Impression  Pt admitted with above diagnosis. PTA pt lived at home with his wife, independent, working, and driving. Pt currently with functional limitations due to the deficits listed below (see PT Problem List). On eval, he demo independent bed mobility, and mod I transfers. He required CGA ambulation 450' without AD, and CGA ascend/descend 12 steps with R rail. Slow, sluggish gait. Mildly unsteady but no overt LOB. Pt will benefit from acute skilled PT to increase their independence and safety with mobility to allow discharge. PT to follow acutely. No follow up services or DME needs.          If plan is discharge home, recommend the following: Assistance with cooking/housework;Help with stairs or ramp for entrance;Assist for transportation   Can travel by private vehicle        Equipment Recommendations None recommended by PT  Recommendations for Other Services       Functional Status Assessment Patient has had a recent decline in their functional status and demonstrates the ability to make significant improvements in function in a reasonable and predictable amount of time.     Precautions / Restrictions Precautions Precautions: Fall;Other (comment) Precaution Comments: seizure Restrictions Weight Bearing Restrictions Per Provider Order: No      Mobility  Bed Mobility Overal bed mobility: Independent                  Transfers Overall transfer level: Modified independent Equipment used: None                    Ambulation/Gait Ambulation/Gait assistance: Contact guard assist Gait Distance (Feet): 450 Feet Assistive device: None Gait  Pattern/deviations: Step-through pattern, Decreased stride length Gait velocity: slow, sluggish     General Gait Details: mildly unsteady but no overt LOB  Stairs Stairs: Yes Stairs assistance: Contact guard assist Stair Management: One rail Right, Alternating pattern, Forwards Number of Stairs: 12    Wheelchair Mobility     Tilt Bed    Modified Rankin (Stroke Patients Only)       Balance Overall balance assessment: Mild deficits observed, not formally tested                                           Pertinent Vitals/Pain Pain Assessment Pain Assessment: 0-10 Pain Score: 7  Pain Location: all over Pain Descriptors / Indicators: Discomfort, Sore Pain Intervention(s): Monitored during session, Repositioned    Home Living Family/patient expects to be discharged to:: Private residence Living Arrangements: Spouse/significant other;Parent Available Help at Discharge: Family Type of Home: Apartment Home Access: Stairs to enter Entrance Stairs-Rails: Right Entrance Stairs-Number of Steps: 18 (interior steps)   Home Layout: One level Home Equipment: None      Prior Function Prior Level of Function : Independent/Modified Independent;Working/employed;Driving                     Extremity/Trunk Assessment   Upper Extremity Assessment Upper Extremity Assessment: Defer to OT evaluation    Lower Extremity Assessment Lower Extremity Assessment: Overall WFL for tasks assessed    Cervical /  Trunk Assessment Cervical / Trunk Assessment: Normal  Communication   Communication Communication: No apparent difficulties  Cognition Arousal: Alert Behavior During Therapy: WFL for tasks assessed/performed Overall Cognitive Status: Within Functional Limits for tasks assessed                                          General Comments General comments (skin integrity, edema, etc.): BP 83/50 MAP 60  sitting 121/84 (94)    Exercises      Assessment/Plan    PT Assessment Patient needs continued PT services  PT Problem List Decreased balance;Pain;Decreased mobility;Decreased activity tolerance       PT Treatment Interventions Gait training;Functional mobility training;Therapeutic activities;Stair training;Balance training    PT Goals (Current goals can be found in the Care Plan section)  Acute Rehab PT Goals Patient Stated Goal: home PT Goal Formulation: With patient Time For Goal Achievement: 02/10/24 Potential to Achieve Goals: Good    Frequency Min 1X/week     Co-evaluation               AM-PAC PT 6 Clicks Mobility  Outcome Measure Help needed turning from your back to your side while in a flat bed without using bedrails?: None Help needed moving from lying on your back to sitting on the side of a flat bed without using bedrails?: None Help needed moving to and from a bed to a chair (including a wheelchair)?: None Help needed standing up from a chair using your arms (e.g., wheelchair or bedside chair)?: None Help needed to walk in hospital room?: A Little Help needed climbing 3-5 steps with a railing? : A Little 6 Click Score: 22    End of Session Equipment Utilized During Treatment: Gait belt Activity Tolerance: Patient tolerated treatment well Patient left: in bed;with call bell/phone within reach;with family/visitor present Nurse Communication: Mobility status PT Visit Diagnosis: Difficulty in walking, not elsewhere classified (R26.2)    Time: 9186-9164 PT Time Calculation (min) (ACUTE ONLY): 22 min   Charges:   PT Evaluation $PT Eval Low Complexity: 1 Low   PT General Charges $$ ACUTE PT VISIT: 1 Visit         Sari MATSU., PT  Office # [phone]   Erven Sari Shaker 01/27/2024, 11:21 AM

## 2024-01-27 NOTE — Progress Notes (Signed)
 PROGRESS NOTE  Jason [name]  DOB: January 18, 1976  PCP: Patient, No Pcp Per FMW:[id number]  DOA: 01/25/2024  LOS: 2 days  Hospital Day: 3  Brief narrative: Jason [name] is a 48 y.o. 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 2/7, patient was brought to the ED from work for further evaluation after he was found on the floor unresponsive with blood around his mouth.  He was slow to wake up and remained same responsive thereafter.  In the ED, he had a witnessed generalized tonic clonic seizure.  He recovered after Ativan  but remained obtunded. EEG showed status epilepticus. Neurology was consulted and patient was loaded with fosphenytoin  Patient reported compliance to Keppra  2 g twice daily at home.  It was continued. He last had a breakthrough seizure in January which was deemed to be in the setting of poor sleep.  Patient was admitted to ICU. Started on long-term EEG Clinically improved, transferred out to [facility] on 2/9  Subjective: Patient was seen and examined this morning. Pleasant middle-aged Caucasian male.  Somnolent but opens eyes on command.  Wife at bedside.  She stated that he was wide-awake earlier and ambulated on the hallway after which he was tired. I spoke with him about the UDS positive for cocaine  which caught the wife by surprise.  Patient did not elaborate and I did not push. Remains hemodynamically stable  Assessment and plan: Breakthrough seizure H/o seizure disorder Reports compliance to Keppra  2 g twice daily at home. Presented with breakthrough seizure.  UDS positive for cocaine .  Also positive for opiates which he is chronically on for back pain. Neurology consultation appreciated.  LTM EEG obtained.  2/8, it showed an electrographic seizure that clinically correlated with overt seizure activity.  EEG subsequently improved and was discontinued. Per neurology, patient probably  had breakthrough seizure followed by status epilepticus most likely secondary to cocaine  use. Gradually improving mental status. Plan is to hopefully discharge home tomorrow on previous dose of Keppra . Fosphenytoin  was started on admission and has been discontinued. Seizure precautions advised Continue to follow-up with neurology as an outpatient  Cocaine  use Counseled to quit  Chronic back pain On chronic opiates   Mobility: Encourage ambulation  Goals of care   Code Status: Full Code     DVT prophylaxis:  enoxaparin  (LOVENOX ) injection 40 mg Start: 01/26/24 1015 SCDs Start: 01/25/24 1509   Antimicrobials: None Fluid: None Consultants: Neurology Family Communication: Wife at bedside  Status: Inpatient Level of care:  Progressive   Patient is from: Home Needs to continue in-hospital care: Gradually improving mental status out of status epilepticus Anticipated d/c to: Hopefully home tomorrow   Diet:  Diet Order             Diet regular Fluid consistency: Thin  Diet effective now                   Scheduled Meds:  enoxaparin  (LOVENOX ) injection  40 mg Subcutaneous Q24H   mouth rinse  15 mL Mouth Rinse 4 times per day    PRN meds: guaiFENesin , LORazepam , mouth rinse, oxyCODONE -acetaminophen , polyethylene glycol   Infusions:   levETIRAcetam  2,000 mg (01/27/24 1028)    Antimicrobials: Anti-infectives (From admission, onward)    None       Objective: Vitals:   01/27/24 1000 01/27/24 1049  BP: 110/74 114/84  Pulse: 79 72  Resp: (!) 9 13  Temp:  98.7 F (37.1 C)  SpO2: 95% 94%    Intake/Output Summary (Last 24 hours) at 01/27/2024 1151 Last data filed at 01/27/2024 0200 Gross per 24 hour  Intake 670.61 ml  Output 2200 ml  Net -1529.39 ml   Filed Weights   01/25/24 0354 01/26/24 0500 01/27/24 0347  Weight: 93 kg 83.3 kg 82.6 kg   Weight change: -0.7 kg Body mass index is 27.69 kg/m.   Physical Exam: General exam: Pleasant,  middle-aged Caucasian male Skin: No rashes, lesions or ulcers. HEENT: Atraumatic, normocephalic, no obvious bleeding Lungs: Clear to auscultation bilaterally,  CVS: S1, S2, no murmur,   GI/Abd: Soft, nontender, nondistended, bowel sound present,   CNS: Somnolent, opens eyes on command.  Able to answer orientation questions Psychiatry: Mood appropriate,  Extremities: No pedal edema, no calf tenderness,   Data Review: I have personally reviewed the laboratory data and studies available.  F/u labs ordered Unresulted Labs (From admission, onward)     Start     Ordered   01/26/24 1330  Phenytoin  level, free and total  Once-Timed,   TIMED       Question:  Specimen collection method  Answer:  Lab=Lab collect   01/26/24 0915   01/25/24 1516  Procalcitonin  Daily,   R     References:    Procalcitonin Lower Respiratory Tract Infection AND Sepsis Procalcitonin Algorithm   01/25/24 1515   01/25/24 1515  Drug Screen 10 W/Conf, Serum  Once,   R        01/25/24 1515           Total time spent in review of labs and imaging, patient evaluation, formulation of plan, documentation and communication with family: 45 minutes  Signed, Chapman Rota, MD [facility] 01/27/2024

## 2024-01-27 NOTE — Progress Notes (Addendum)
 Patient transfer from 4N ICU to 4NP-10. A&O X4 with 4/10 generalized pain. Was just given percocet in the ICU. Wife at bedside and personal belongings with patient. VSS and assessment completed. Cardiac tele applied, call bell within reach, and bed alarm on.

## 2024-01-28 DIAGNOSIS — G40901 Epilepsy, unspecified, not intractable, with status epilepticus: Secondary | ICD-10-CM | POA: Diagnosis not present

## 2024-01-28 LAB — GLUCOSE, CAPILLARY
Glucose-Capillary: 97 mg/dL (ref 70–99)
Glucose-Capillary: 149 mg/dL — ABNORMAL HIGH (ref 70–99)

## 2024-01-28 LAB — PHENYTOIN LEVEL, FREE AND TOTAL
Phenytoin, Free: 1.3 ug/mL (ref 1.0–2.0)
Phenytoin, Total: 16 ug/mL (ref 10.0–20.0)

## 2024-01-28 LAB — PROCALCITONIN: Procalcitonin: <0.1 ng/mL

## 2024-01-28 MED ORDER — LEVETIRACETAM 750 MG PO TABS: 2000.0000 mg | ORAL_TABLET | Freq: Two times a day (BID) | ORAL | Status: DC

## 2024-01-28 NOTE — Progress Notes (Signed)
 Order to discharge patient home. Significant other at bedside. Family/friend ride on route. Will go to discharge lounge to wait for ride. Discharge instructions/AVS given to and reviewed with patient. Education provided as needed. Patient verbalized understanding. 2 PIV's removed by this RN. No DME needs at this time. Personal belongings sent home with patient. Home via private vehicle.

## 2024-01-28 NOTE — Discharge Summary (Signed)
 Physician Discharge Summary  Jason [name] ZOX:[id number] DOB: Apr 25, 1976 DOA: 01/25/2024  PCP: Patient, No Pcp Per  Admit date: 01/25/2024 Discharge date: 01/28/2024  Admitted From: Home Discharge disposition: Home  Recommendations at discharge:  Ensure compliance to Keppra  Avoid drug use   Brief narrative: Jason [name] is a 48 y.o. 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 2/7, patient was brought to the ED from work for further evaluation after he was found on the floor unresponsive with blood around his mouth.  He was slow to wake up and remained same responsive thereafter.  In the ED, he had a witnessed generalized tonic clonic seizure.  He recovered after Ativan  but remained obtunded. EEG showed status epilepticus. Neurology was consulted and patient was loaded with fosphenytoin  Patient reported compliance to Keppra  2 g twice daily at home.  It was continued. He last had a breakthrough seizure in January which was deemed to be in the setting of poor sleep.  Patient was admitted to ICU. Started on long-term EEG Clinically improved, transferred out to [facility] on 2/9  Subjective: Patient was seen and examined this morning. Lying on bed.  Not in distress no new symptoms.  Wife at bedside. Feels ready to go  Assessment and plan: Breakthrough seizure H/o seizure disorder Reports compliance to Keppra  2 g twice daily at home. Presented with breakthrough seizure.  UDS positive for cocaine .  Also positive for opiates which he is chronically on for back pain. Neurology consultation appreciated.  LTM EEG obtained.  2/8, it showed an electrographic seizure that clinically correlated with overt seizure activity.  EEG subsequently improved and was discontinued. Per neurology, patient probably had breakthrough seizure followed by status epilepticus most likely secondary to cocaine  use. Mental status  gradually improved. Fosphenytoin  was started earlier on admission and has been discontinued. Per neurology combination, patient is now continued on previous dose of Keppra  at 2000 mg twice daily. Patient states he has enough Keppra  at home Continue to follow-up with neurology as an outpatient Seizure precautions advised  Cocaine  use Counseled to quit  Chronic back pain On chronic opiates   Goals of care   Code Status: Full Code   Diet:  Diet Order             Diet general           Diet regular Fluid consistency: Thin  Diet effective now                   Nutritional status:  Body mass index is 27.72 kg/m.       Wounds:  -    Discharge Exam:   Vitals:   01/27/24 2310 01/28/24 0300 01/28/24 0500 01/28/24 0743  BP: (!) 131/93 127/73  (!) 155/96  Pulse: 93 68  76  Resp: 20 14  14   Temp: 98.9 F (37.2 C) 98.7 F (37.1 C)  98.6 F (37 C)  TempSrc: Oral Oral  Oral  SpO2: 94% 95%  94%  Weight:   82.7 kg   Height:        Body mass index is 27.72 kg/m.   General exam: Pleasant, middle-aged Caucasian male Skin: No rashes, lesions or ulcers. HEENT: Atraumatic, normocephalic, no obvious bleeding Lungs: Clear to auscultation bilaterally,  CVS: S1, S2, no murmur,   GI/Abd: Soft, nontender, nondistended, bowel sound present,   CNS: Alert, awake, oriented x 3 Psychiatry: Mood appropriate,  Extremities: No pedal edema, no calf tenderness,   Follow ups:    Follow-up Information     [facility] Follow up.   Contact information: [address] [place]  [address] [phone]                Discharge Instructions:   Discharge Instructions     Call MD for:  difficulty breathing, headache or visual disturbances   Complete by: As directed    Call MD for:  extreme fatigue   Complete by: As directed    Call MD for:  hives   Complete by: As directed    Call MD for:  persistant  dizziness or light-headedness   Complete by: As directed    Call MD for:  persistant nausea and vomiting   Complete by: As directed    Call MD for:  severe uncontrolled pain   Complete by: As directed    Call MD for:  temperature >100.4   Complete by: As directed    Diet general   Complete by: As directed    Discharge instructions   Complete by: As directed    Recommendations at discharge:   Ensure compliance to Keppra   Avoid drug use  Seizure precautions: -Per [place]  DMV statutes, patients with seizures are not allowed to drive until they have been seizure-free for six months.  -Use caution when using heavy equipment or power tools.  -Avoid working on ladders or at heights.  -Take showers instead of baths. Ensure the water temperature is not too high on the home water heater.  -Do not go swimming alone.  -Do not lock yourself in a room alone (i.e. bathroom). -When caring for infants or small children, sit down when holding, feeding, or changing them to minimize risk of injury to the child in the event you have a seizure.  -Maintain good sleep hygiene. -Avoid alcohol.    If patient has another seizure, call 911 and bring them back to the ED if: A.  The seizure lasts longer than 5 minutes.      B.  The patient doesn't wake shortly after the seizure or has new problems such as difficulty seeing, speaking or moving following the seizure C.  The patient was injured during the seizure D.  The patient has a temperature over 102 F (39C) E.  The patient vomited during the seizure and now is having trouble breathing      PDMP reviewed this encounter.   Opioid taper instructions: It is important to wean off of your opioid medication as soon as possible. If you do not need pain medication after your surgery it is ok to stop day one. Opioids include: Codeine, Hydrocodone(Norco, Vicodin), Oxycodone (Percocet, oxycontin ) and hydromorphone amongst others.  Long term and even  short term use of opiods can cause: Increased pain response Dependence Constipation Depression Respiratory depression And more.  Withdrawal symptoms can include Flu like symptoms Nausea, vomiting And more Techniques to manage these symptoms Hydrate well Eat regular healthy meals Stay active Use relaxation techniques(deep breathing, meditating, yoga) Do Not substitute Alcohol to help with tapering If you have been on opioids for less than two weeks and do not have pain than it is ok to stop all together.  Plan to wean off of opioids This plan should start within one week post op of your joint replacement. Maintain the same interval or time between taking each dose and first decrease the dose.  Cut the total daily intake of opioids by one tablet each day Next start to increase the time between doses. The last dose that should be eliminated is the evening dose.        General discharge instructions: Follow with Primary MD Patient, No Pcp Per in 7 days  Please request your PCP  to go over your hospital tests, procedures, radiology results at the follow up. Please get your medicines reviewed and adjusted.  Your PCP may decide to repeat certain labs or tests as needed. Do not drive, operate heavy machinery, perform activities at heights, swimming or participation in water activities or provide baby sitting services if your were admitted for syncope or siezures until you have seen by Primary MD or a Neurologist and advised to do so again. [place]  Controlled Substance Reporting System database was reviewed. Do not drive, operate heavy machinery, perform activities at heights, swim, participate in water activities or provide baby-sitting services while on medications for pain, sleep and mood until your outpatient physician has reevaluated you and advised to do so again.  You are strongly recommended to comply with the dose, frequency and duration of prescribed medications. Activity:  As tolerated with Full fall precautions use walker/cane & assistance as needed Avoid using any recreational substances like cigarette, tobacco, alcohol, or non-prescribed drug. If you experience worsening of your admission symptoms, develop shortness of breath, life threatening emergency, suicidal or homicidal thoughts you must seek medical attention immediately by calling 911 or calling your MD immediately  if symptoms less severe. You must read complete instructions/literature along with all the possible adverse reactions/side effects for all the medicines you take and that have been prescribed to you. Take any new medicine only after you have completely understood and accepted all the possible adverse reactions/side effects.  Wear Seat belts while driving. You were cared for by a hospitalist during your hospital stay. If you have any questions about your discharge medications or the care you received while you were in the hospital after you are discharged, you can call the unit and ask to speak with the hospitalist or the covering physician. Once you are discharged, your primary care physician will handle any further medical issues. Please note that NO REFILLS for any discharge medications will be authorized once you are discharged, as it is imperative that you return to your primary care physician (or establish a relationship with a primary care physician if you do not have one).   Increase activity slowly   Complete by: As directed        Discharge Medications:   Allergies as of 01/28/2024       Reactions   Hydrocodone-acetaminophen  Nausea And Vomiting        Medication List     TAKE these medications    acetaminophen  500 MG tablet Commonly known as: TYLENOL  Take 1,000 mg by mouth every 6 (six) hours as needed for headache.   ibuprofen 200 MG tablet Commonly known as: ADVIL Take 400 mg by mouth every 6 (six) hours as needed for moderate pain.   levETIRAcetam  1000 MG  tablet Commonly known as: KEPPRA  Take 2,000 mg by mouth 2 (two) times daily.   oxyCODONE -acetaminophen  5-325 MG tablet Commonly known as: Percocet Take 1-2 tablets by mouth every 4 (four) hours as needed for severe pain.         The results of significant diagnostics from this hospitalization (including imaging, microbiology, ancillary and laboratory) are listed below for reference.  Procedures and Diagnostic Studies:   Overnight EEG with video Result Date: 01/26/2024 Arleene Lack, MD     01/26/2024  9:10 PM Patient Name: SADAMU DISPENZA MRN: [id number] Epilepsy Attending: Arleene Lack Referring Physician/Provider: Kimberley Penman, MD Duration: 01/25/2024 1631 to 01/26/2024 1631  Patient history: 49yo M with h/o epilepsy getting eeg to evaluate for seizure  Level of alertness: Awake, asleep  AEDs during EEG study: LEV  Technical aspects: This EEG study was done with scalp electrodes positioned according to the 10-20 International system of electrode placement. Electrical activity was reviewed with band pass filter of 1-70Hz , sensitivity of 7 uV/mm, display speed of 43mm/sec with a 60Hz  notched filter applied as appropriate. EEG data were recorded continuously and digitally stored.  Video monitoring was available and reviewed as appropriate.  Description: EEG showed continuous generalized 3 to 6 Hz theta-delta slowing. Generalized and maximal bifrontal polyspikes were also noted, periodic at 1.5-2Hz , with overlying rhythmicity followed by 1-3 seconds of generalized attenuation. Npo clinical signs were noted. 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 At 1409 on 01/25/2024, patient head forced head deviation to left followed by stiffening and extension of bilateral upper extremities which then evolved into generalized tonic -clonic seizure. EEG showed generalized and maximal frontal spikes. Subsequently, EEG was difficult to interpret due to  significant myogenic artifact. Seizure lasted for about 1 minute. EEG then showed generalized attenuation. IV ativan  was administered on 01/25/2024 at 1413. Subsequently, EEG improved and showed posterior dominant rhythm consists of 8 Hz activity of moderate voltage (25-35 uV) seen predominantly in posterior head regions, symmetric and reactive to eye opening and eye closing. Sleep was characterized by vertex waves, sleep spindles (12 to 14 Hz), maximal frontocentral region.  Generalized maximal bifrontal polyspikes were noted. ABNORMALITY - Electrographic status epilepticus, generalized vs frontal - Continuous slow, generalized - Polyspikes, generalized and maximal bifrontal  IMPRESSION: This study initially showed evidence of electrographic status epilepticus. Although EEG shows generalized onset, status epilepticus with frontal origin can heave similar appearance. One generalized tonic clonic seizure was also noted on 01/25/2024 at 1413, lasting for about 1 minute, unclear onset. Additionally there was moderate diffuse encephalopathy. IV ativan  was administered on 01/25/2024 at 1413. 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 Focal epilepsy with frontal onset can have similar appearance.   Arleene Lack   DG Chest Port 1 View Result Date: 01/26/2024 CLINICAL DATA:  Altered mental status Aspiration EXAM: PORTABLE CHEST - 1 VIEW COMPARISON:  01/25/2024 FINDINGS: Cardiomediastinal silhouette and pulmonary vasculature are within normal limits. Lungs are clear. IMPRESSION: No acute cardiopulmonary process. Electronically Signed   By: Elester Grim M.D.   On: 01/26/2024 07:33   EEG adult Result Date: 01/25/2024 Arleene Lack, MD     01/25/2024  2:35 PM Patient Name: TEMARION HANLIN MRN: [id number] Epilepsy Attending: Arleene Lack Referring Physician/Provider: Kimberley Penman, MD Date: 01/25/2024 Duration: 21.48 mins Patient history: 48yo M with h/o epilepsy  getting eeg to evaluate for seizure Level of alertness: Awake AEDs during EEG study: LEV Technical aspects: This EEG study was done with scalp electrodes positioned according to the 10-20 International system of electrode placement. Electrical activity was reviewed with band pass filter of 1-70Hz , sensitivity of 7 uV/mm, display speed of 78mm/sec with a 60Hz  notched filter applied as appropriate. EEG data were recorded continuously and digitally stored.  Video monitoring was available and reviewed as appropriate. Description: EEG  showed continuous generalized 3 to 6 Hz theta-delta slowing. Generalized and maximal bifrontal polyspikes were also noted, periodic at 1.5-2Hz , with overlying rhythmicity followed by 1-3 seconds of generalized attenuation. Npo clinical signs were noted. 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 Hyperventilation and photic stimulation were not performed.   ABNORMALITY - Electrographic status epilepticus, generalized vs frontal - Continuous slow, generalized IMPRESSION: This study showed evidence of electrographic status epilepticus. Although EEG shows generalized onset, status epilepticus with frontal origin can heave similar appearance. Additionally there was moderate diffuse encephalopathy. Dr Renaee Caro was notified. Arleene Lack   CT Cervical Spine Wo Contrast Result Date: 01/25/2024 CLINICAL DATA:  48 year old male status post unwitnessed fall at work, posterior head bleeding. Seizure history. EXAM: CT CERVICAL SPINE WITHOUT CONTRAST TECHNIQUE: Multidetector CT imaging of the cervical spine was performed without intravenous contrast. Multiplanar CT image reconstructions were also generated. RADIATION DOSE REDUCTION: This exam was performed according to the departmental dose-optimization program which includes automated exposure control, adjustment of the mA and/or kV according to patient size and/or use of iterative  reconstruction technique. COMPARISON:  CT head and face today.  Cervical spine CT 01/04/2023. FINDINGS: Alignment: Stable straightening of cervical lordosis. Cervicothoracic junction alignment is within normal limits. Bilateral posterior element alignment is within normal limits. Skull base and vertebrae: Normal background bone mineralization. Visualized skull base is intact. No atlanto-occipital dissociation. C1 and C2 appear intact and aligned. No acute osseous abnormality identified. Soft tissues and spinal canal: No prevertebral fluid or swelling. No visible canal hematoma. Negative visible noncontrast deep soft tissue spaces of the neck. Disc levels: Chronic cervical disc and endplate degeneration C4-C5 through C6-C7, stable and mild to moderate for age. Upper chest: Mild T4 superior endplate compression is age indeterminate. Other visible upper thoracic vertebrae, ribs appear intact. Retained secretions is *CRASH* that small volume of retained secretions throughout the visible trachea, extending to the left mainstem bronchus. Visible upper lobes are clear. There is more mild tree-in-bud nodular opacity in the superior segment of the left lower lobe on series 4, image 126. Negative visible noncontrast mediastinum. IMPRESSION: 1. No acute traumatic injury identified in the cervical spine. 2. Age indeterminate mild T4 compression fracture. 3. Layering secretions in the trachea and left mainstem bronchus, and mild tree-in-bud nodular opacity in the superior segment of the left lower lobe. Constellation suspicious for mild aspiration, mild distal airway inflammation. Electronically Signed   By: Marlise Simpers M.D.   On: 01/25/2024 04:41   CT Maxillofacial WO CM Result Date: 01/25/2024 CLINICAL DATA:  48 year old male status post unwitnessed fall at work, posterior head bleeding. Seizure history. EXAM: CT MAXILLOFACIAL WITHOUT CONTRAST TECHNIQUE: Multidetector CT imaging of the maxillofacial structures was performed.  Multiplanar CT image reconstructions were also generated. RADIATION DOSE REDUCTION: This exam was performed according to the departmental dose-optimization program which includes automated exposure control, adjustment of the mA and/or kV according to patient size and/or use of iterative reconstruction technique. COMPARISON:  CT head and cervical spine today. Prior face CT 01/04/2023. FINDINGS: Osseous: Poor dentition, extensive bilateral dental caries. Mandible remains intact and aligned. Bilateral maxilla, zygoma, pterygoid, and nasal bones appear stable and intact. Intact central skull base. Cervical spine detailed separately. Orbits: No orbital wall fracture. Globes and intraorbital soft tissues appears symmetric and normal. Sinuses: New ethmoid predominant paranasal sinus mucosal thickening and trace new layering sinus fluid bilaterally. Tympanic cavities and mastoids remain clear. Soft tissues: Negative visible noncontrast larynx, pharynx, parapharyngeal spaces, retropharyngeal space,  sublingual space, submandibular spaces, masticator and parotid spaces. Limited intracranial: Reported separately. IMPRESSION: 1. No acute traumatic injury identified in the Face. 2. Poor dentition. 3. Bilateral paranasal sinus inflammation new from last month. Electronically Signed   By: Marlise Simpers M.D.   On: 01/25/2024 04:37   CT Head Wo Contrast Result Date: 01/25/2024 CLINICAL DATA:  48 year old male status post unwitnessed fall at work, posterior head bleeding. Seizure history. EXAM: CT HEAD WITHOUT CONTRAST TECHNIQUE: Contiguous axial images were obtained from the base of the skull through the vertex without intravenous contrast. RADIATION DOSE REDUCTION: This exam was performed according to the departmental dose-optimization program which includes automated exposure control, adjustment of the mA and/or kV according to patient size and/or use of iterative reconstruction technique. COMPARISON:  Head CT 01/04/2023. FINDINGS:  Brain: Normal cerebral volume. No midline shift, ventriculomegaly, mass effect, evidence of mass lesion, intracranial hemorrhage or evidence of cortically based acute infarction. Gray-white matter differentiation is within normal limits throughout the brain. Simple appearing cystic change to the pineal gland with no regional mass effect, normal variant. Vascular: No suspicious intracranial vascular hyperdensity. Mild Calcified atherosclerosis at the skull base. Skull: Carious dentition, face CT reported separately today. Calvarium appears stable and intact. Sinuses/Orbits: New small fluid levels layering in the paranasal sinuses, although appears to be low-density, inflammatory. And there is mild new ethmoid and frontal sinus mucosal thickening. Tympanic cavities and mastoids remain clear. Other: Face CT today is reported separately. Broad-based posterior convexity scalp hematoma or contusion to the left of midline on series 4, image 49. No scalp soft tissue gas. Underlying calvarium appears intact. Discrete orbit or scalp soft tissue injury identified. IMPRESSION: 1. Left posterior scalp soft tissue injury. No underlying skull fracture. 2. Stable and normal noncontrast CT appearance of the brain. 3. New paranasal sinus inflammation since last month. Carious dentition. Face CT reported separately. Electronically Signed   By: Marlise Simpers M.D.   On: 01/25/2024 04:33   DG Chest Port 1 View Result Date: 01/25/2024 CLINICAL DATA:  48 year old male status post unwitnessed fall at work. Altered mental status. EXAM: PORTABLE CHEST 1 VIEW COMPARISON:  None Available. FINDINGS: Portable AP semi upright view at 0407 hours. Low normal lung volumes. Normal cardiac size and mediastinal contours. Visualized tracheal air column is within normal limits. Allowing for portable technique the lungs are clear. No acute osseous abnormality identified. Negative visible bowel gas. IMPRESSION: Negative portable chest. Electronically Signed    By: Marlise Simpers M.D.   On: 01/25/2024 04:32     Labs:   Basic Metabolic Panel: Recent Labs  Lab 01/25/24 0357 01/25/24 1502 01/26/24 0412  NA 140  --  142  K 3.6  --  3.7  CL 106  --  106  CO2 24  --  23  GLUCOSE 101*  --  90  BUN 11  --  7  CREATININE 0.92  --  0.79  CALCIUM 8.7*  --  8.7*  MG  --  2.2  --    GFR Estimated Creatinine Clearance: 119.6 mL/min (by C-G formula based on SCr of 0.79 mg/dL). Liver Function Tests: Recent Labs  Lab 01/25/24 0357  AST 32  ALT 23  ALKPHOS 52  BILITOT 0.3  PROT 7.2  ALBUMIN 3.9   No results for input(s): "LIPASE", "AMYLASE" in the last 168 hours. No results for input(s): "AMMONIA" in the last 168 hours. Coagulation profile No results for input(s): "INR", "PROTIME" in the last 168 hours.  CBC: Recent Labs  Lab 01/25/24 0357 01/26/24 0412  WBC 3.4* 5.8  NEUTROABS 1.6*  --   HGB 12.6* 12.3*  HCT 37.2* 36.1*  MCV 85.5 85.5  PLT 212 189   Cardiac Enzymes: No results for input(s): "CKTOTAL", "CKMB", "CKMBINDEX", "TROPONINI" in the last 168 hours. BNP: Invalid input(s): "POCBNP" CBG: Recent Labs  Lab 01/27/24 1612 01/27/24 1953 01/27/24 2311 01/28/24 0308 01/28/24 0754  GLUCAP 95 97 151* 97 149*   D-Dimer No results for input(s): "DDIMER" in the last 72 hours. Hgb A1c No results for input(s): "HGBA1C" in the last 72 hours. Lipid Profile No results for input(s): "CHOL", "HDL", "LDLCALC", "TRIG", "CHOLHDL", "LDLDIRECT" in the last 72 hours. Thyroid function studies No results for input(s): "TSH", "T4TOTAL", "T3FREE", "THYROIDAB" in the last 72 hours.  Invalid input(s): "FREET3" Anemia work up No results for input(s): "VITAMINB12", "FOLATE", "FERRITIN", "TIBC", "IRON", "RETICCTPCT" in the last 72 hours. Microbiology Recent Results (from the past 240 hours)  MRSA Next Gen by PCR, Nasal     Status: None   Collection Time: 01/25/24  4:46 PM   Specimen: Nasal Mucosa; Nasal Swab  Result Value Ref Range Status    MRSA by PCR Next Gen NOT DETECTED NOT DETECTED Final    Comment: (NOTE) The GeneXpert MRSA Assay (FDA approved for NASAL specimens only), is one component of a comprehensive MRSA colonization surveillance program. It is not intended to diagnose MRSA infection nor to guide or monitor treatment for MRSA infections. Test performance is not FDA approved in patients less than 60 years old. Performed at [facility], [address], [place], [place] [address]     Time coordinating discharge: 45 minutes  Signed: Daryel Rozell  [facility] 01/28/2024, 10:49 AM

## 2024-02-06 LAB — OXYCODONES,MS,WB/SP RFX
Oxycocone: 9.8 ng/mL
Oxycodones Confirmation: POSITIVE
Oxymorphone: NEGATIVE ng/mL

## 2024-02-13 LAB — DRUG SCREEN 10 W/CONF, SERUM
Amphetamines, IA: NEGATIVE ng/mL
Barbiturates, IA: NEGATIVE ug/mL
Benzodiazepines, IA: NEGATIVE ng/mL
Cocaine & Metabolite, IA: POSITIVE ng/mL — AB
Methadone, IA: NEGATIVE ng/mL
Opiates, IA: NEGATIVE ng/mL
Oxycodones, IA: POSITIVE ng/mL — AB
Phencyclidine, IA: NEGATIVE ng/mL
Propoxyphene, IA: NEGATIVE ng/mL
THC(Marijuana) Metabolite, IA: NEGATIVE ng/mL

## 2024-02-13 LAB — COCAINE,MS,WB/SP RFX
Benzoylecgonine: 22 ng/mL
Cocaine Confirmation: POSITIVE
Cocaine: NEGATIVE ng/mL

## 2024-06-14 ENCOUNTER — Inpatient Hospital Stay (HOSPITAL_COMMUNITY)
Admission: EM | Admit: 2024-06-14 | Discharge: 2024-06-19 | DRG: 100 | Disposition: A | Discharge status: Home / Self Care | Attending: Internal Medicine | Admitting: Internal Medicine

## 2024-06-14 ENCOUNTER — Emergency Department (HOSPITAL_COMMUNITY)

## 2024-06-14 ENCOUNTER — Other Ambulatory Visit: Payer: Self-pay

## 2024-06-14 ENCOUNTER — Encounter (HOSPITAL_COMMUNITY)

## 2024-06-14 ENCOUNTER — Encounter (HOSPITAL_COMMUNITY): Payer: Self-pay

## 2024-06-14 ENCOUNTER — Inpatient Hospital Stay (HOSPITAL_COMMUNITY)

## 2024-06-14 DIAGNOSIS — E538 Deficiency of other specified B group vitamins: Secondary | ICD-10-CM | POA: Diagnosis present

## 2024-06-14 DIAGNOSIS — F1410 Cocaine abuse, uncomplicated: Secondary | ICD-10-CM | POA: Diagnosis present

## 2024-06-14 DIAGNOSIS — J690 Pneumonitis due to inhalation of food and vomit: Secondary | ICD-10-CM | POA: Diagnosis present

## 2024-06-14 DIAGNOSIS — G40901 Epilepsy, unspecified, not intractable, with status epilepticus: Principal | ICD-10-CM | POA: Diagnosis present

## 2024-06-14 DIAGNOSIS — Z87891 Personal history of nicotine dependence: Secondary | ICD-10-CM

## 2024-06-14 DIAGNOSIS — R569 Unspecified convulsions: Principal | ICD-10-CM

## 2024-06-14 DIAGNOSIS — Z885 Allergy status to narcotic agent status: Secondary | ICD-10-CM

## 2024-06-14 DIAGNOSIS — Z79899 Other long term (current) drug therapy: Secondary | ICD-10-CM

## 2024-06-14 DIAGNOSIS — R410 Disorientation, unspecified: Secondary | ICD-10-CM | POA: Diagnosis not present

## 2024-06-14 DIAGNOSIS — E872 Acidosis, unspecified: Secondary | ICD-10-CM | POA: Diagnosis present

## 2024-06-14 DIAGNOSIS — F32A Depression, unspecified: Secondary | ICD-10-CM | POA: Diagnosis present

## 2024-06-14 DIAGNOSIS — D649 Anemia, unspecified: Secondary | ICD-10-CM | POA: Diagnosis present

## 2024-06-14 DIAGNOSIS — I959 Hypotension, unspecified: Secondary | ICD-10-CM | POA: Diagnosis present

## 2024-06-14 DIAGNOSIS — E162 Hypoglycemia, unspecified: Secondary | ICD-10-CM | POA: Diagnosis present

## 2024-06-14 DIAGNOSIS — Z91148 Patient's other noncompliance with medication regimen for other reason: Secondary | ICD-10-CM

## 2024-06-14 DIAGNOSIS — Z79891 Long term (current) use of opiate analgesic: Secondary | ICD-10-CM

## 2024-06-14 DIAGNOSIS — F1310 Sedative, hypnotic or anxiolytic abuse, uncomplicated: Secondary | ICD-10-CM | POA: Diagnosis present

## 2024-06-14 LAB — COMPREHENSIVE METABOLIC PANEL WITH GFR
ALT: 26 U/L (ref 0–44)
AST: 35 U/L (ref 15–41)
Albumin: 4.2 g/dL (ref 3.5–5.0)
Alkaline Phosphatase: 77 U/L (ref 38–126)
Anion gap: 13 (ref 5–15)
BUN: 17 mg/dL (ref 6–20)
CO2: 22 mmol/L (ref 22–32)
Calcium: 9.8 mg/dL (ref 8.9–10.3)
Chloride: 103 mmol/L (ref 98–111)
Creatinine, Ser: 0.93 mg/dL (ref 0.61–1.24)
GFR, Estimated: >60 mL/min (ref >60)
Glucose, Bld: 128 mg/dL — ABNORMAL HIGH (ref 70–99)
Potassium: 3.7 mmol/L (ref 3.5–5.1)
Sodium: 138 mmol/L (ref 135–145)
Total Bilirubin: 0.9 mg/dL (ref 0.0–1.2)
Total Protein: 8.4 g/dL — ABNORMAL HIGH (ref 6.5–8.1)

## 2024-06-14 LAB — CBC WITH DIFFERENTIAL/PLATELET
Abs Immature Granulocytes: 0.05 10*3/uL (ref 0.00–0.07)
Basophils Absolute: 0 10*3/uL (ref 0.0–0.1)
Basophils Relative: 0 %
Eosinophils Absolute: 0.1 10*3/uL (ref 0.0–0.5)
Eosinophils Relative: 0 %
HCT: 42.3 % (ref 39.0–52.0)
Hemoglobin: 15.2 g/dL (ref 13.0–17.0)
Immature Granulocytes: 0 %
Lymphocytes Relative: 7 %
Lymphs Abs: 0.9 10*3/uL (ref 0.7–4.0)
MCH: 29.6 pg (ref 26.0–34.0)
MCHC: 35.9 g/dL (ref 30.0–36.0)
MCV: 82.5 fL (ref 80.0–100.0)
Monocytes Absolute: 0.5 10*3/uL (ref 0.1–1.0)
Monocytes Relative: 4 %
Neutro Abs: 10.7 10*3/uL — ABNORMAL HIGH (ref 1.7–7.7)
Neutrophils Relative %: 89 %
Platelets: 298 10*3/uL (ref 150–400)
RBC: 5.13 MIL/uL (ref 4.22–5.81)
RDW: 12.6 % (ref 11.5–15.5)
WBC: 12.3 10*3/uL — ABNORMAL HIGH (ref 4.0–10.5)
nRBC: 0 % (ref 0.0–0.2)

## 2024-06-14 LAB — URINALYSIS, W/ REFLEX TO CULTURE (INFECTION SUSPECTED)
Bilirubin Urine: NEGATIVE
Glucose, UA: NEGATIVE mg/dL
Hgb urine dipstick: NEGATIVE
Ketones, ur: NEGATIVE mg/dL
Leukocytes,Ua: NEGATIVE
Nitrite: NEGATIVE
Protein, ur: 30 mg/dL — AB
Specific Gravity, Urine: 1.024 (ref 1.005–1.030)
pH: 5 (ref 5.0–8.0)

## 2024-06-14 LAB — BASIC METABOLIC PANEL WITH GFR
Anion gap: 15 (ref 5–15)
BUN: 14 mg/dL (ref 6–20)
CO2: 18 mmol/L — ABNORMAL LOW (ref 22–32)
Calcium: 10 mg/dL (ref 8.9–10.3)
Chloride: 104 mmol/L (ref 98–111)
Creatinine, Ser: 1.04 mg/dL (ref 0.61–1.24)
GFR, Estimated: >60 mL/min (ref >60)
Glucose, Bld: 144 mg/dL — ABNORMAL HIGH (ref 70–99)
Potassium: 4 mmol/L (ref 3.5–5.1)
Sodium: 137 mmol/L (ref 135–145)

## 2024-06-14 LAB — TSH: TSH: 0.592 u[IU]/mL (ref 0.350–4.500)

## 2024-06-14 LAB — RAPID URINE DRUG SCREEN, HOSP PERFORMED
Amphetamines: NOT DETECTED
Barbiturates: NOT DETECTED
Benzodiazepines: POSITIVE — AB
Cocaine: POSITIVE — AB
Opiates: NOT DETECTED
Tetrahydrocannabinol: NOT DETECTED

## 2024-06-14 LAB — FOLATE: Folate: 12.7 ng/mL (ref >5.9)

## 2024-06-14 LAB — T4, FREE: Free T4: 0.64 ng/dL (ref 0.61–1.12)

## 2024-06-14 LAB — GLUCOSE, CAPILLARY
Glucose-Capillary: 173 mg/dL — ABNORMAL HIGH (ref 70–99)
Glucose-Capillary: 133 mg/dL — ABNORMAL HIGH (ref 70–99)
Glucose-Capillary: 115 mg/dL — ABNORMAL HIGH (ref 70–99)

## 2024-06-14 LAB — VITAMIN B12: Vitamin B-12: 281 pg/mL (ref 180–914)

## 2024-06-14 LAB — ETHANOL: Alcohol, Ethyl (B): <15 mg/dL (ref <15)

## 2024-06-14 LAB — MAGNESIUM: Magnesium: 2.5 mg/dL — ABNORMAL HIGH (ref 1.7–2.4)

## 2024-06-14 MED ORDER — ENOXAPARIN SODIUM 40 MG/0.4ML IJ SOSY
40.0000 mg | PREFILLED_SYRINGE | INTRAMUSCULAR | Status: DC
  Administered 2024-06-14 – 2024-06-18 (×5): 40 mg via SUBCUTANEOUS
  Filled 2024-06-14 (×5): qty 0.4

## 2024-06-14 MED ORDER — SODIUM CHLORIDE 0.9 % IV SOLN
1651.0000 mg | Freq: Once | INTRAVENOUS | Status: DC
  Filled 2024-06-14: qty 12.7

## 2024-06-14 MED ORDER — ACETAMINOPHEN 325 MG PO TABS
650.0000 mg | ORAL_TABLET | Freq: Four times a day (QID) | ORAL | Status: DC | PRN
  Administered 2024-06-14: 650 mg via ORAL
  Filled 2024-06-14: qty 2

## 2024-06-14 MED ORDER — ACETAMINOPHEN 325 MG PO TABS: 650.0000 mg | ORAL_TABLET | ORAL | Status: DC | PRN

## 2024-06-14 MED ORDER — ACETAMINOPHEN 650 MG RE SUPP: 650.0000 mg | Freq: Four times a day (QID) | RECTAL | Status: DC | PRN

## 2024-06-14 MED ORDER — DOCUSATE SODIUM 100 MG PO CAPS: 100.0000 mg | ORAL_CAPSULE | Freq: Two times a day (BID) | ORAL | Status: DC | PRN

## 2024-06-14 MED ORDER — ONDANSETRON HCL 4 MG/2ML IJ SOLN: 4.0000 mg | Freq: Four times a day (QID) | INTRAMUSCULAR | Status: DC | PRN

## 2024-06-14 MED ORDER — CHLORHEXIDINE GLUCONATE CLOTH 2 % EX PADS
6.0000 | MEDICATED_PAD | Freq: Every day | CUTANEOUS | Status: DC
  Administered 2024-06-15 – 2024-06-19 (×4): 6 via TOPICAL

## 2024-06-14 MED ORDER — SODIUM CHLORIDE 0.9 % IV SOLN
3.0000 g | Freq: Four times a day (QID) | INTRAVENOUS | Status: DC
  Administered 2024-06-14 – 2024-06-16 (×8): 3 g via INTRAVENOUS
  Filled 2024-06-14 (×12): qty 8

## 2024-06-14 MED ORDER — LEVETIRACETAM (KEPPRA) 500 MG/5 ML ADULT IV PUSH
1000.0000 mg | Freq: Once | INTRAVENOUS | Status: AC
  Administered 2024-06-14: 1000 mg via INTRAVENOUS
  Filled 2024-06-14: qty 10

## 2024-06-14 MED ORDER — LEVETIRACETAM (KEPPRA) 500 MG/5 ML ADULT IV PUSH
2000.0000 mg | Freq: Two times a day (BID) | INTRAVENOUS | Status: DC
  Administered 2024-06-15 – 2024-06-17 (×5): 2000 mg via INTRAVENOUS
  Filled 2024-06-14 (×5): qty 20

## 2024-06-14 MED ORDER — ORAL CARE MOUTH RINSE: 15.0000 mL | OROMUCOSAL | Status: DC | PRN

## 2024-06-14 MED ORDER — LACTATED RINGERS IV SOLN: INTRAVENOUS | Status: DC

## 2024-06-14 MED ORDER — SODIUM CHLORIDE 0.9 % IV SOLN
1600.0000 mg | Freq: Once | INTRAVENOUS | Status: AC
  Administered 2024-06-14: 1600 mg via INTRAVENOUS
  Filled 2024-06-14: qty 12.31

## 2024-06-14 MED ORDER — VITAMIN B-12 1000 MCG PO TABS
1000.0000 ug | ORAL_TABLET | Freq: Every day | ORAL | Status: DC
  Administered 2024-06-17 – 2024-06-19 (×3): 1000 ug via ORAL
  Filled 2024-06-14 (×3): qty 1

## 2024-06-14 MED ORDER — POLYETHYLENE GLYCOL 3350 17 G PO PACK
17.0000 g | PACK | Freq: Every day | ORAL | Status: DC | PRN
  Administered 2024-06-17: 17 g via ORAL
  Filled 2024-06-14: qty 1

## 2024-06-14 MED ORDER — LORAZEPAM 2 MG/ML IJ SOLN
INTRAMUSCULAR | Status: AC
  Filled 2024-06-14: qty 1

## 2024-06-14 MED ORDER — HEPARIN SODIUM (PORCINE) 5000 UNIT/ML IJ SOLN
5000.0000 [IU] | Freq: Three times a day (TID) | INTRAMUSCULAR | Status: DC
  Administered 2024-06-14: 5000 [IU] via SUBCUTANEOUS
  Filled 2024-06-14: qty 1

## 2024-06-14 MED ORDER — LEVETIRACETAM (KEPPRA) 500 MG/5 ML ADULT IV PUSH
1000.0000 mg | Freq: Two times a day (BID) | INTRAVENOUS | Status: DC
  Administered 2024-06-14: 1000 mg via INTRAVENOUS
  Filled 2024-06-14: qty 10

## 2024-06-14 MED ORDER — ONDANSETRON HCL 4 MG PO TABS: 4.0000 mg | ORAL_TABLET | Freq: Four times a day (QID) | ORAL | Status: DC | PRN

## 2024-06-14 NOTE — Consult Note (Signed)
 NEUROLOGY CONSULT NOTE   Date of service: June 14, 2024 Patient Name: Jason [name] MRN:  [id number] DOB:  12-16-1976 Chief Complaint: seizures Requesting Provider: Maree Harder, MD  History of Present Illness  Jason [name] is a 48 y.o. 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  She notes while it is not unusual for him to sleep days at a time when he is up he has not been interested in activities and has been very withdrawn.  He did miss his evening dose of Keppra  the night prior to admission; he manages his own medications but she checks behind him  He initially seized at 230 or 3 AM 6/28, refused transport to the hospital, took his home Keppra  dose at 4 AM but then had another seizure at 7 AM.  Was started on 1 g twice daily Keppra  at [facility].  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 for which he did receive fosphenytoin   Wife notes he had another seizure in March for which she refused transport, no other seizures  Current AEDs: - Keppra  2 g twice daily  Spell description - Semiology: Generalized tonic-clonic activity - Prodome: Wife reports patient sometimes has a warning and can tell her he is about to have a seizure - Post-spell:  Confusion - Triggers: Recently appears to be related to cocaine  use - Frequency: Previously well-controlled on Keppra  without any breakthrough seizures  Prior Anti-seizure medications -Keppra   -Lamotrigine     ROS  Unable to assess secondary to patient's mental status   Past History   Past Medical History:  Diagnosis Date   Seizures (HCC)     Past Surgical History:  Procedure Laterality Date   TONSILLECTOMY      Family History: History reviewed. No pertinent family history.  Social History  reports that he has never smoked. He does not have any smokeless tobacco history on file. He reports that he does not drink alcohol and does not use drugs.  Allergies  Allergen Reactions   Hydrocodone-Acetaminophen  Nausea And Vomiting    Medications   Current Facility-Administered Medications:    acetaminophen  (TYLENOL ) tablet 650 mg, 650 mg, Oral, Q4H PRN, Adolph, Lauren E, PA-C   Ampicillin-Sulbactam (UNASYN) 3 g in sodium chloride  0.9 % 100 mL IVPB, 3 g, Intravenous, Q6H, Tat, Alm, MD   NOREEN ON 06/15/2024] Chlorhexidine  Gluconate Cloth 2 % PADS 6 each, 6 each, Topical, Q0600, Tat, David, MD   docusate sodium  (COLACE) capsule 100 mg, 100 mg, Oral, BID PRN, Adolph, Lauren E, PA-C   enoxaparin  (LOVENOX ) injection 40 mg, 40 mg, Subcutaneous, Q24H, Autry, Lauren E, PA-C   levETIRAcetam  (KEPPRA ) undiluted injection 1,000 mg, 1,000 mg, Intravenous, Q12H, Evonnie Alm, MD, 1,000 mg at 06/14/24 1905  LORazepam  (ATIVAN ) 2 MG/ML injection, , , ,    ondansetron  (ZOFRAN ) injection 4 mg, 4 mg, Intravenous, Q6H PRN, Adolph, Lauren E, PA-C   PHENObarbital (LUMINAL) 1,600 mg in sodium chloride  0.9 % 100 mL IVPB, 1,600 mg, Intravenous, Once, Vuk Skillern L, MD   polyethylene glycol (MIRALAX  / GLYCOLAX ) packet 17 g, 17 g, Oral, Daily PRN, Adolph Tinnie BRAVO, PA-C  Vitals   Vitals:   Jul 11, 2024 1814 Jul 11, 2024 1816 07-11-2024 1900 07-11-24 2000  BP:  (!) 162/97 (!) 157/87 (!) 152/110  Pulse:  (!)  112 95 95  Resp:  (!) 24 10 18   Temp: 99.2 F (37.3 C)     TempSrc: Axillary     SpO2:  97% 98% 99%  Weight: 80.8 kg     Height: 5' 8 (1.727 m)       Body mass index is 27.08 kg/m.   Physical Exam   Constitutional: Appears ill, moaning and restless with some tardive dyskinetic movements Psych: Affect minimally interactive Eyes: No scleral injection.  HENT: No OP obstruction.  Very poor dentition with multiple missing teeth.  Dried blood around lips Head: Normocephalic.  Cardiovascular: Normal rate and regular rhythm.  Respiratory: Effort normal, mildly tachypneic GI: Soft.  No distension. There is no tenderness.   Neurologic Examination   Mental Status: Patient is very lethargic, with repeated noxious stimulation can follows some commands intermittently.  Gives a thumbs up, when asked to show 2 fingers gives a thumbs up again.  Squeezes and lets go with the right upper extremity.  States his name is Jason [name].  Reports he is in his 48s.  Does not answer other questions Cranial Nerves: II: Pupils are equal, round, and reactive to light.   III,IV, VI: Orients to examiner's voice bilaterally V: Facial sensation is symmetric to eyelash brush VII: Facial movement is symmetric.  VIII: hearing is intact to voice Motor: Moving all 4 extremities grossly equally.  At least antigravity with bilateral upper extremities.  Withdraws in the bilateral lower extremities Sensory: Equally responsive to noxious stimulation in all 4 Deep Tendon Reflexes: Diminished throughout    Labs/Imaging/Neurodiagnostic studies   CBC:  Recent Labs  Lab 07/11/24 0820  WBC 12.3*  NEUTROABS 10.7*  HGB 15.2  HCT 42.3  MCV 82.5  PLT 298   Basic Metabolic Panel:  Lab Results  Component Value Date   NA 138 07/11/2024   K 3.7 2024/07/11   CO2 22 2024/07/11   GLUCOSE 128 (H) 2024-07-11   BUN 17 2024/07/11   CREATININE 0.93 11-Jul-2024   CALCIUM 9.8 07-11-24   GFRNONAA >60 07/11/2024   GFRAA   07/14/2007    >60        The eGFR has been calculated using the MDRD equation. This calculation has not been validated in all clinical   Urine Drug Screen:     Component Value Date/Time   LABOPIA NONE DETECTED 11-Jul-2024 0900   COCAINSCRNUR POSITIVE (A) 2024/07/11 0900   LABBENZ POSITIVE (A) 07-11-2024 0900   AMPHETMU NONE DETECTED 07/11/24 0900   THCU NONE DETECTED 07-11-24 0900   LABBARB NONE DETECTED 2024/07/11 0900    Alcohol Level     Component Value Date/Time   [facility] <15 Jul 11, 2024 0820   INR No results found for: INR APTT No results found for: APTT AED levels:  Lab Results  Component Value Date   PHENYTOIN  16.0 01/26/2024    CT Head without contrast(Personally reviewed): Stable and negative noncontrast CT appearance of the brain.  ASSESSMENT  LEEVON UPPERMAN is a 48 y.o. male with a past medical history significant for epilepsy with provoked seizures in the setting of cocaine  abuse.  Certainly could be in nonconvulsive status epilepticus based on current examination though this also may be consistent with postictal state, delirium secondary to seizures, seizure medications, hospitalization.  Overnight EEG to clarify  Certainly at risk of aspiration with acute seizures, will check chest x-ray  If not returning to baseline will consider MRI brain but holding off for now as it would not change management at this time  He has been loaded with phenobarbital which will hopefully protect him as the cocaine  is metabolized, will hold off on standing doses  RECOMMENDATIONS  - Continue home Keppra  dose of 2000 mg twice daily - S/p 20 mg/kg loading dose of phenobarbital - Overnight EEG monitoring - Low normal B12 noted, will start 1000 mcg daily with goal B12 greater than 600 - Neurology will follow along, discussed with CCM at bedside  ______________________________________________________________________   Lola Jernigan MD-PhD [facility] [phone] Available 7 PM to 7 AM, outside of these hours please call Neurologist on call as listed on Amion.  CRITICAL CARE Performed by: Lola LITTIE Jernigan   Total critical care time: 60 minutes  Critical care time was exclusive of separately billable procedures and treating other patients.  Critical care was necessary to treat or prevent imminent or life-threatening deterioration.  Critical care was time spent personally by me on the following activities: development of treatment plan with patient and/or surrogate as well as nursing, discussions with consultants, evaluation of patient's response to treatment, examination of patient, obtaining history from patient or surrogate, ordering and performing treatments and interventions, ordering and review of laboratory studies, ordering and review of radiographic studies, pulse oximetry and re-evaluation of patient's condition.

## 2024-06-14 NOTE — ED Notes (Signed)
 Wife Miranda at bedside, would like to know if ER MD will add Lamictal 100 mg po daily. Said 5 years ago when he took lamictal with Keppra  he was seizure free.

## 2024-06-14 NOTE — ED Provider Notes (Signed)
 [place] EMERGENCY DEPARTMENT AT [facility] Provider Note   CSN: [id number] Arrival date & time: 06/14/24  9190     Patient presents with: Seizures   Jason [name] is a 48 y.o. male.  {Add pertinent medical, surgical, social history, OB history to YEP:67052} Patient has a history of seizures.  He had 1 seizure around 230 this morning and his wife gave him his Keppra  dose at 430.  He then had another seizure around 730.  According to his wife he has had 2 seizures in the last year prior to the seizures.  And prior to that he had gone 3 years without a seizure but he was also on Lamictal at that time.   Seizures      Prior to Admission medications   Medication Sig Start Date End Date Taking? Authorizing Provider  acetaminophen  (TYLENOL ) 500 MG tablet Take 1,000 mg by mouth every 6 (six) hours as needed for headache.    [provider]  ibuprofen (ADVIL,MOTRIN) 200 MG tablet Take 400 mg by mouth every 6 (six) hours as needed for moderate pain.    [provider]  levETIRAcetam  (KEPPRA ) 1000 MG tablet Take 2,000 mg by mouth 2 (two) times daily.    [provider]  oxyCODONE -acetaminophen  (PERCOCET) 5-325 MG per tablet Take 1-2 tablets by mouth every 4 (four) hours as needed for severe pain. 08/08/14   Carlyle Lenis, MD    Allergies: Hydrocodone-acetaminophen     Review of Systems  Neurological:  Positive for seizures.    Updated Vital Signs BP (!) 127/94   Pulse (!) 103   Temp 98.7 F (37.1 C) (Oral)   Resp 13   Ht 5' 8 (1.727 m)   Wt 82.6 kg   SpO2 97%   BMI 27.67 kg/m   Physical Exam  (all labs ordered are listed, but only abnormal results are displayed) Labs Reviewed  CBC WITH DIFFERENTIAL/PLATELET - Abnormal; Notable for the following components:      Result Value   WBC 12.3 (*)    Neutro Abs 10.7 (*)    All other components within normal limits  COMPREHENSIVE METABOLIC PANEL WITH GFR - Abnormal; Notable for the  following components:   Glucose, Bld 128 (*)    Total Protein 8.4 (*)    All other components within normal limits  RAPID URINE DRUG SCREEN, HOSP PERFORMED - Abnormal; Notable for the following components:   Cocaine  POSITIVE (*)    Benzodiazepines POSITIVE (*)    All other components within normal limits  ETHANOL  LEVETIRACETAM  LEVEL    EKG: None  Radiology: CT Head Wo Contrast Result Date: 06/14/2024 CLINICAL DATA:  48 year old male with recurrent seizure like activity this morning. EXAM: CT HEAD WITHOUT CONTRAST TECHNIQUE: Contiguous axial images were obtained from the base of the skull through the vertex without intravenous contrast. RADIATION DOSE REDUCTION: This exam was performed according to the departmental dose-optimization program which includes automated exposure control, adjustment of the mA and/or kV according to patient size and/or use of iterative reconstruction technique. COMPARISON:  Head CT 01/25/2024. FINDINGS: Brain: Cerebral volume is stable and within normal limits. No midline shift, ventriculomegaly, mass effect, evidence of mass lesion, intracranial hemorrhage or evidence of cortically based acute infarction. Gray-white matter differentiation is within normal limits throughout the brain. Vascular: Generalized increase in density of the vasculature compared to February. No suspicious intracranial vascular hyperdensity. Skull: No acute osseous abnormality identified. Sinuses/Orbits: Improved bilateral paranasal sinus aeration since February. Tympanic cavities  and mastoids appear clear. Other: Visualized orbits and scalp soft tissues are within normal limits. IMPRESSION: Stable and negative noncontrast CT appearance of the brain. Electronically Signed   By: VEAR Hurst M.D.   On: 06/14/2024 09:22    {Document cardiac monitor, telemetry assessment procedure when appropriate:32947} Procedures   Medications Ordered in the ED  levETIRAcetam  (KEPPRA ) undiluted injection 1,000 mg  (1,000 mg Intravenous Given 06/14/24 0834)   Patient is still postictal 4 hours after his second seizure.  He will awake and he knows his name but he is very confused and does not recognize his wife.  I spoke with neurology Dr. Lindzen and he agreed with observation in the hospital at [facility].  He does not see the need to add Lamictal to the patient regiment for right now.  Obviously feels like the patient needs to stop cocaine  and benzos.  Although he did say 1 could consider Lamictal 25 mg twice a day if the patient and wife are persistent.  He also does not believe the patient needs another EEG   {Click here for ABCD2, HEART and other calculators REFRESH Note before signing:1}                              Medical Decision Making Amount and/or Complexity of Data Reviewed Labs: ordered. Radiology: ordered.   Patient postictal after the second seizure today.  He will be admitted to medicine for observation  {Document critical care time when appropriate  Document review of labs and clinical decision tools ie CHADS2VASC2, etc  Document your independent review of radiology images and any outside records  Document your discussion with family members, caretakers and with consultants  Document social determinants of health affecting pt's care  Document your decision making why or why not admission, treatments were needed:32947:::1}   Final diagnoses:  None    ED Discharge Orders     None

## 2024-06-14 NOTE — Progress Notes (Signed)
 eLink Physician-Brief Progress Note Patient Name: Jason [name] DOB: 12/25/1975 MRN: [id number]   Date of Service  06/14/2024  HPI/Events of Note  48 yr old male with history of seizure disorder and substance abuse transferred from [facility] for assistance of management of seizures likely related to cocaine  use.  Patient on low flow [place] and somnolent.    eICU Interventions  Chart reviewed     Intervention Category Evaluation Type: New Patient Evaluation  CLAUDENE AGENT, P 06/14/2024, 10:27 PM

## 2024-06-14 NOTE — ED Triage Notes (Signed)
 Seizure 3 am this am, lasted couple minutes. EMS came out evaluated did not go to hospital. 0710 had another seizure lasted about a min. Given Keppra  around 4 am. [facility] EMS brought patient from home. CBG 92, original GCS 12 now 14. Currently post ictal. 130s HR when EMS arrived now 113. BP 132/76, 98% RA, regular RR, No meds given by EMS

## 2024-06-14 NOTE — Hospital Course (Addendum)
 48 y/o male with history of seizure, chronic back pain, tobacco abuse, cocaine  use presenting with seizure.  Patient is currently somnolent by history.  History is obtained from review of the patient's spouse at the bedside.  Spouse states that the patient had a tonic-clonic type seizure around 2:30 AM on 06/14/2024.  She states that he was a bit lethargic afterward.  Subsequently, the patient did awaken and was able to follow commands and speak to his wife.  Apparently, she gave him his usual dose of Keppra  at 4:30 AM on 06/14/2024.  Unfortunately, patient had another tonic-clonic seizure lasting about a minute around 7:10 AM.  As result, the patient was brought to the emergency department for further evaluation and treatment. In the emergency department was hemodynamically stable with oxygen saturation 97% room air.  WBC 12.3, hemoglobin 15.2, platelets 298.  Sodium 138.7, 2022, serum creatinine 0.93.  LFTs were unremarkable.  CT of the brain was negative for acute findings.  UDS was positive for cocaine  and benzodiazepines.  The patient was given 1 g of Keppra  IV. EDP is discussed with neurology, Dr. Lindzen.  He felt the patient could be monitored in independent as status continues to improve.  Notably, the patient had a recent hospital admission from 01/25/2024 to 01/28/2024 for status epilepticus.  It was felt that this was triggered by his cocaine  use.  He was initially given fosphenytoin , and was subsequently discharged home on his usual dose of Keppra  2 g twice daily.

## 2024-06-14 NOTE — Progress Notes (Signed)
 Patient experienced a tonic-clonic seizure at 1758 that lasted for 2 minutes and 30 seconds. Vitals are currently stable. 96% on 2LNC. BP 161/48. MAP 90. HR 109. Pt is awake, disoriented, and not responding to commands. Responds to painful stimuli. Dr. Evonnie aware and at bedside.

## 2024-06-14 NOTE — Progress Notes (Addendum)
 Responded to nursing call:   Pt had tonic-clonic seizure lasting 30 seconds  Subjective: Patient is post ictal.  He is awake. Able to protect airway.  Per RN, patient was awake and following commands prior to event  Vitals:   06/14/24 1206 06/14/24 1300 06/14/24 1500 06/14/24 1715  BP: (!) 142/95 (!) 140/87 (!) 151/97 (!) 162/85  Pulse: (!) 109 99 95 (!) 129  Resp: 19 17 16    Temp: 98.1 F (36.7 C) 98.8 F (37.1 C) 98.1 F (36.7 C) 98.1 F (36.7 C)  TempSrc: Oral Oral Axillary Oral  SpO2: 95% 100% 100% 96%  Weight:      Height:       CV--RRR Lung--scattered rales.   Abd--soft+BS/NT   Assessment/Plan:  Seizure On my examination, he remains very somnolent with sonorous respirations, but awakes promptly to stimulation but does not follow commands nor answer questions -high risk for intubation with recurrent seizure -may need intubation if seizure recurs to allow for adequate doses of sedatives to be administered  -discussed with neurology, Dr. Ethel consult after transfer to [facility] -Dr. Voncile advised load with phenobarb -Transfer to ICU at [facility] for now -contacting CCM for transfer to [facility] currently --spoke with Dr. Dennison accept in transfer  Pt likely aspirated--start Unasyn  The patient is critically ill with multiple organ systems failure and requires high complexity decision making for assessment and support, frequent evaluation and titration of therapies, application of advanced monitoring technologies and extensive interpretation of multiple databases.  Critical care time - 40 mins.     Alm Schneider, DO [facility]

## 2024-06-14 NOTE — Consult Note (Signed)
 NAME:  Jason [name], MRN:  [id number], DOB:  May 27, 1976, LOS: 0 ADMISSION DATE:  06/14/2024, CONSULTATION DATE:  6/28 REFERRING MD: Tat, hospitalist CHIEF COMPLAINT: status epilepticus     History of Present Illness:  48 year old male with past medical history of seizures who presented to [facility] on 6/28 with seizures. Information obtained from wife in ED who reported seizure around 0230. He returned to baseline and was able to take his Keppra  dose around 0430. He then had second tonic-clonic seizure at 0730. Notes total of 2 seizures in the past two years prior to this. Labs in ED notable for WBC 12.3, glucose 128, ethanol negative, UA negative, UDS + cocaine , benzos. CT head negative. Given 1g Keppra  load. He failed to return to baseline after period of observation in ED and was admitted for observation. Neurology was also consulted. Later today (6/28) had 3rd tonic-clonic seizure on floor lasting about 2.5 minutes. Neurology was paged and recommended phenobarbital load. He was transferred to [facility] with plan to transfer to [facility] for ongoing evaluation.   On my exam on admission, wife is at bedside. She states he has been depressed and in bed for the past week not eating much. States last night around 1830, he went to have BM and came back diffusely sweating which she states has not happened before.   Pertinent  Medical History  seizures  Significant Hospital Events: Including procedures, antibiotic start and stop dates in addition to other pertinent events   6/28: [facility] for x2 seizure at home>admitted>seized>phenobarb> ICU>request to transfer to [facility] for ongoing eval and management   Interim History / Subjective:  Laying in bed, eyes closed. He opens eyes to verbal stimulation. On 2LNC sat 100%.   Objective   Blood pressure (!) 162/97, pulse (!) 112, temperature 99.2 F (37.3 C), temperature source Axillary, resp. rate (!) 24, height 5' 8 (1.727 m), weight 80.8 kg, SpO2 97%.         Intake/Output Summary (Last 24 hours) at 06/14/2024 1914 Last data filed at 06/14/2024 0900 Gross per 24 hour  Intake --  Output 900 ml  Net -900 ml   Filed Weights   06/14/24 0822 06/14/24 1814  Weight: 82.6 kg 80.8 kg    Examination: General: middle aged male, laying in bed, no acute distress  HENT: ncat, anicteric sclera, perrla, dried ?blood around his lips, poor dentition  Lungs: CTAB, resp even and unlabored Cardiovascular: s1s2, no m/r/g, rate 90s Abdomen: flat, soft, +BS Extremities: no edema Neuro: wakes to voice but closes eyes back. Wakes more purposeful with painful stimulus. Does not tell me his name. Tells me I dont know to last cocaine  use.  GU: condom cath  [facility] Problem list    Assessment & Plan:  Status epilepticus  History of seizures  Total of 3 seizures 6/28 without clear return to baseline between. Received 1g keppra  load in ED. Phenobarbital load on admission after 3rd seizure. CT head negative. Likely related to drug use. - neurology following, appreciate assistance  - f/u TSH, T4, folate, B12, keppra  level  - high risk for intubation need  - PRN benzo for seizures  - seizure precautions, q4h glucose checks, normothermia, elyte correction - con't tele and pulse ox monitoring   Aspiration event Reported from [facility]  - start unasyn for 3-5 day coverage  - npo, will start maintenance LR @50cc /hr until able to take PO - o2 for sat >92%  - pulm hygiene   Polysubstance use  UDS + benzos and cocaine . Hospitalist notes no PDMP Rx for benzos.  - watch for benzo withdrawal  - counsel on cessation when appropriate   Best Practice (right click and Reselect all SmartList Selections daily)   Diet/type: NPO DVT prophylaxis: LMWH GI prophylaxis: N/A Lines: N/A Foley:  N/A Code Status:  full code Last date of multidisciplinary goals of care discussion [updated wife on plan of care]  Labs   CBC: Recent Labs  Lab 06/14/24 0820  WBC  12.3*  NEUTROABS 10.7*  HGB 15.2  HCT 42.3  MCV 82.5  PLT 298    Basic Metabolic Panel: Recent Labs  Lab 06/14/24 0820  NA 138  K 3.7  CL 103  CO2 22  GLUCOSE 128*  BUN 17  CREATININE 0.93  CALCIUM 9.8   GFR: Estimated Creatinine Clearance: 95 mL/min (by C-G formula based on SCr of 0.93 mg/dL). Recent Labs  Lab 06/14/24 0820  WBC 12.3*    Liver Function Tests: Recent Labs  Lab 06/14/24 0820  AST 35  ALT 26  ALKPHOS 77  BILITOT 0.9  PROT 8.4*  ALBUMIN 4.2   No results for input(s): LIPASE, AMYLASE in the last 168 hours. No results for input(s): AMMONIA in the last 168 hours.  ABG No results found for: PHART, PCO2ART, PO2ART, HCO3, TCO2, ACIDBASEDEF, O2SAT   Coagulation Profile: No results for input(s): INR, PROTIME in the last 168 hours.  Cardiac Enzymes: No results for input(s): CKTOTAL, CKMB, CKMBINDEX, TROPONINI in the last 168 hours.  HbA1C: No results found for: HGBA1C  CBG: Recent Labs  Lab 06/14/24 1859  GLUCAP 173*    Review of Systems:   As above  Past Medical History:  He,  has a past medical history of Seizures (HCC).   Surgical History:   Past Surgical History:  Procedure Laterality Date   TONSILLECTOMY       Social History:   reports that he has never smoked. He does not have any smokeless tobacco history on file. He reports that he does not drink alcohol and does not use drugs.   Family History:  His family history is not on file.   Allergies Allergies  Allergen Reactions   Hydrocodone-Acetaminophen  Nausea And Vomiting     Home Medications  Prior to Admission medications   Medication Sig Start Date End Date Taking? Authorizing Provider  levETIRAcetam  (KEPPRA ) 1000 MG tablet Take 2,000 mg by mouth 2 (two) times daily.   Yes [provider]  oxyCODONE -acetaminophen  (PERCOCET) 10-325 MG tablet Take 1 tablet by mouth every 8 (eight) hours as needed for pain. 03/13/24  Yes  [provider]  Rimegepant Sulfate (NURTEC) 75 MG TBDP Take 1 tablet by mouth as needed (migraine).   Yes [provider]     Critical care time: 61   Tinnie FORBES Adolph DEVONNA [facility] 06/14/24 7:58 PM  Please see [email or web address] for pager details.  From 7A-7P if no response, please call [phone] After hours, please call ELink [phone]

## 2024-06-14 NOTE — ED Notes (Signed)
 Called lab for them to add UA and culture from random drug screen

## 2024-06-14 NOTE — ED Notes (Signed)
 EDP at bedside during triage

## 2024-06-14 NOTE — ED Notes (Signed)
 RN spoke to transport, who will transport patient to room 306 soon.

## 2024-06-14 NOTE — Progress Notes (Signed)
 Pt continues to be in post ictal phase. Pt responding to commands when asked, and continue to sleep when not spoken too. Wife called this nurse and said that he said he was in pain so this nurse when into assess. PRN tylenol  given and followed commands to drink water. After tylenol  was given pt started to have a tonic clonic seizure that lasted approx 30secs. Rapid called, suction and o2 at bedside. Suction pt to make sure pills where still not in his mouth (pt swallowed pills before seizure). O2 applied to 2L. Sating at 96% BP 162/85 (110 MAP) 129 pulse. Dr. Evonnie at bedside. N.O to send pt to stepdown. Pt is now disoriented and not following commands. Reponds to painful stimuli.

## 2024-06-14 NOTE — H&P (Signed)
 History and Physical    Patient: Jason [name] FMW:[id number] DOB: 1976-11-20 DOA: 06/14/2024 DOS: the patient was seen and examined on 06/14/2024 PCP: Patient, No Pcp Per  Patient coming from: Home  Chief Complaint:  Chief Complaint  Patient presents with   Seizures   HPI: KINCAID TIGER is a 48 y/o male with history of seizure, chronic back pain, tobacco abuse, cocaine  use presenting with seizure.  Patient is currently somnolent by history.  History is obtained from review of the patient's spouse at the bedside.  Spouse states that the patient had a tonic-clonic type seizure around 2:30 AM on 06/14/2024.  She states that he was a bit lethargic afterward.  Subsequently, the patient did awaken and was able to follow commands and speak to his wife.  Apparently, she gave him his usual dose of Keppra  at 4:30 AM on 06/14/2024.  Unfortunately, patient had another tonic-clonic seizure lasting about a minute around 7:10 AM.  As result, the patient was brought to the emergency department for further evaluation and treatment. In the emergency department was hemodynamically stable with oxygen saturation 97% room air.  WBC 12.3, hemoglobin 15.2, platelets 298.  Sodium 138.7, 2022, serum creatinine 0.93.  LFTs were unremarkable.  CT of the brain was negative for acute findings.  UDS was positive for cocaine  and benzodiazepines.  The patient was given 1 g of Keppra  IV. EDP is discussed with neurology, Dr. Lindzen.  He felt the patient could be monitored in independent as status continues to improve.  Notably, the patient had a recent hospital admission from 01/25/2024 to 01/28/2024 for status epilepticus.  It was felt that this was triggered by his cocaine  use.  He was initially given fosphenytoin , and was subsequently discharged home on his usual dose of Keppra  2 g twice daily.  Review of Systems: As mentioned in the history of present illness. All other systems reviewed and are negative. Past Medical History:   Diagnosis Date   Seizures [facility])    Past Surgical History:  Procedure Laterality Date   TONSILLECTOMY     Social History:  reports that he has never smoked. He does not have any smokeless tobacco history on file. He reports that he does not drink alcohol and does not use drugs.  Allergies  Allergen Reactions   Hydrocodone-Acetaminophen  Nausea And Vomiting    History reviewed. No pertinent family history.  Prior to Admission medications   Medication Sig Start Date End Date Taking? Authorizing Provider  acetaminophen  (TYLENOL ) 500 MG tablet Take 1,000 mg by mouth every 6 (six) hours as needed for headache.    [provider]  ibuprofen (ADVIL,MOTRIN) 200 MG tablet Take 400 mg by mouth every 6 (six) hours as needed for moderate pain.    [provider]  levETIRAcetam  (KEPPRA ) 1000 MG tablet Take 2,000 mg by mouth 2 (two) times daily.    [provider]  oxyCODONE -acetaminophen  (PERCOCET) 5-325 MG per tablet Take 1-2 tablets by mouth every 4 (four) hours as needed for severe pain. 08/08/14   Carlyle Lenis, MD    Physical Exam: Vitals:   06/14/24 0819 06/14/24 0820 06/14/24 0822 06/14/24 1045  BP: (!) 122/90 (!) 122/90  (!) 127/94  Pulse:  (!) 109  (!) 103  Resp: (!) 7 19  13   Temp:  98.7 F (37.1 C)    TempSrc:  Oral    SpO2:  94%  97%  Weight:   82.6 kg   Height:   5' 8 (1.727 m)  GENERAL:  A&O x 1, NAD, well developed, cooperative, follows commands.  Falls back asleep without verbal stimuli HEENT: [place]/AT, No thrush, No icterus, No oral ulcers Neck:  No neck mass, No meningismus, soft, supple CV: RRR, no S3, no S4, no rub, no JVD Lungs:  CTA, no wheeze, no rhonchi, good air movement Abd: soft/NT +BS, nondistended Ext: No edema, no lymphangitis, no cyanosis, no rashes Neuro:  CN II-XII intact, strength 4/5 in RUE, RLE, strength 4/5 LUE, LLE; sensation intact bilateral; no dysmetria; babinski equivocal  Data Reviewed: Data reviewed above in  history  Assessment and Plan: Seizure Disorder -breakthrough seizure triggered by cocaine  use -continue Keppra  -seizure precautions -spouse endorsed compliance with Keppra  at home  Polysubstance abuse -UDS with cocaine  and benzo -PDMP did not show any recent benzo Rx -last percocet filled 03/13/24 - When I discussed with the patient's wife she states  he has never used any cocaine  or benzodiazepine  Acute metabolic encephalopathy - Secondary to postictal state and seizure - Obtain UA - UDS as discussed above - B12 - TSH - Patient remains lethargic and somnolent; falls asleep without verbal stimuli       Advance Care Planning: FULL  Consults: none  Family Communication: spouse 6/28  Severity of Illness: The appropriate patient status for this patient is OBSERVATION. Observation status is judged to be reasonable and necessary in order to provide the required intensity of service to ensure the patient's safety. The patient's presenting symptoms, physical exam findings, and initial radiographic and laboratory data in the context of their medical condition is felt to place them at decreased risk for further clinical deterioration. Furthermore, it is anticipated that the patient will be medically stable for discharge from the hospital within 2 midnights of admission.   Author: Alm Schneider, MD 06/14/2024 11:37 AM  For on call review [email or web address].

## 2024-06-14 NOTE — ED Notes (Signed)
 ED TO INPATIENT HANDOFF REPORT  ED Nurse Name and Phone #: Philippe Collet RN [phone]  S Name/Age/Gender Jason [name] 48 y.o. male Room/Bed: [facility]03/[facility]03  Code Status   Code Status: Full Code  Home/SNF/Other Home Patient oriented to: self Is this baseline? No   Triage Complete: Triage complete  Chief Complaint Seizure [facility]) [R56.9]  Triage Note Seizure 3 am this am, lasted couple minutes. EMS came out evaluated did not go to hospital. 0710 had another seizure lasted about a min. Given Keppra  around 4 am. [facility] brought patient from home. CBG 92, original GCS 12 now 14. Currently post ictal. 130s HR when EMS arrived now 113. BP 132/76, 98% RA, regular RR, No meds given by EMS    Allergies Allergies  Allergen Reactions   Hydrocodone-Acetaminophen  Nausea And Vomiting    Level of Care/Admitting Diagnosis ED Disposition     ED Disposition  Admit   Condition  --   Comment  Hospital Area: [facility] [[id number]]  Level of Care: Med-Surg [16]  Covid Evaluation: Asymptomatic - no recent exposure (last 10 days) testing not required  Diagnosis: Seizure (HCC) [205090]  Admitting Physician: TAT, DAVID [email or web address].Cuna  Attending Physician: TAT, DAVID [[id number]]          B Medical/Surgery History Past Medical History:  Diagnosis Date   Seizures (HCC)    Past Surgical History:  Procedure Laterality Date   TONSILLECTOMY       A IV Location/Drains/Wounds Patient Lines/Drains/Airways Status     Active Line/Drains/Airways     Name Placement date Placement time Site Days   Peripheral IV 06/14/24 18 G Left Antecubital 06/14/24  0817  Antecubital  less than 1            Intake/Output Last 24 hours  Intake/Output Summary (Last 24 hours) at 06/14/2024 1154 Last data filed at 06/14/2024 0900 Gross per 24 hour  Intake --  Output 900 ml  Net -900 ml    Labs/Imaging Results for orders placed or performed during the hospital encounter of 06/14/24 (from  the past 48 hours)  CBC with Differential     Status: Abnormal   Collection Time: 06/14/24  8:20 AM  Result Value Ref Range   WBC 12.3 (H) 4.0 - 10.5 K/uL   RBC 5.13 4.22 - 5.81 MIL/uL   Hemoglobin 15.2 13.0 - 17.0 g/dL   HCT 57.6 60.9 - 47.9 %   MCV 82.5 80.0 - 100.0 fL   MCH 29.6 26.0 - 34.0 pg   MCHC 35.9 30.0 - 36.0 g/dL   RDW 87.3 88.4 - 84.4 %   Platelets 298 150 - 400 K/uL   nRBC 0.0 0.0 - 0.2 %   Neutrophils Relative % 89 %   Neutro Abs 10.7 (H) 1.7 - 7.7 K/uL   Lymphocytes Relative 7 %   Lymphs Abs 0.9 0.7 - 4.0 K/uL   Monocytes Relative 4 %   Monocytes Absolute 0.5 0.1 - 1.0 K/uL   Eosinophils Relative 0 %   Eosinophils Absolute 0.1 0.0 - 0.5 K/uL   Basophils Relative 0 %   Basophils Absolute 0.0 0.0 - 0.1 K/uL   Immature Granulocytes 0 %   Abs Immature Granulocytes 0.05 0.00 - 0.07 K/uL    Comment: Performed at [facility], [address], [place], [place] [address]  Comprehensive metabolic panel     Status: Abnormal   Collection Time: 06/14/24  8:20 AM  Result Value Ref Range   Sodium 138 135 - 145  mmol/L   Potassium 3.7 3.5 - 5.1 mmol/L   Chloride 103 98 - 111 mmol/L   CO2 22 22 - 32 mmol/L   Glucose, Bld 128 (H) 70 - 99 mg/dL    Comment: Glucose reference range applies only to samples taken after fasting for at least 8 hours.   BUN 17 6 - 20 mg/dL   Creatinine, Ser 9.06 0.61 - 1.24 mg/dL   Calcium 9.8 8.9 - 89.6 mg/dL   Total Protein 8.4 (H) 6.5 - 8.1 g/dL   Albumin 4.2 3.5 - 5.0 g/dL   AST 35 15 - 41 U/L   ALT 26 0 - 44 U/L   Alkaline Phosphatase 77 38 - 126 U/L   Total Bilirubin 0.9 0.0 - 1.2 mg/dL   GFR, Estimated >39 >39 mL/min    Comment: (NOTE) Calculated using the CKD-EPI Creatinine Equation (2021)    Anion gap 13 5 - 15    Comment: Performed at [facility], [address], [place], [place] [address]  Ethanol     Status: None   Collection Time: 06/14/24  8:20 AM  Result Value Ref Range   Alcohol, Ethyl (B) <15 <15 mg/dL    Comment:  (NOTE) For medical purposes only. Performed at [facility], [address], [place], [place] [address]   Rapid urine drug screen (hospital performed)     Status: Abnormal   Collection Time: 06/14/24  9:00 AM  Result Value Ref Range   Opiates NONE DETECTED NONE DETECTED   Cocaine  POSITIVE (A) NONE DETECTED   Benzodiazepines POSITIVE (A) NONE DETECTED   Amphetamines NONE DETECTED NONE DETECTED   Tetrahydrocannabinol NONE DETECTED NONE DETECTED   Barbiturates NONE DETECTED NONE DETECTED    Comment: (NOTE) DRUG SCREEN FOR MEDICAL PURPOSES ONLY.  IF CONFIRMATION IS NEEDED FOR ANY PURPOSE, NOTIFY LAB WITHIN 5 DAYS.  LOWEST DETECTABLE LIMITS FOR URINE DRUG SCREEN Drug Class                     Cutoff (ng/mL) Amphetamine and metabolites    1000 Barbiturate and metabolites    200 Benzodiazepine                 200 Opiates and metabolites        300 Cocaine  and metabolites        300 THC                            50 Performed at [facility], [address] [place], [place] [address]    CT Head Wo Contrast Result Date: 06/14/2024 CLINICAL DATA:  48 year old male with recurrent seizure like activity this morning. EXAM: CT HEAD WITHOUT CONTRAST TECHNIQUE: Contiguous axial images were obtained from the base of the skull through the vertex without intravenous contrast. RADIATION DOSE REDUCTION: This exam was performed according to the departmental dose-optimization program which includes automated exposure control, adjustment of the mA and/or kV according to patient size and/or use of iterative reconstruction technique. COMPARISON:  Head CT 01/25/2024. FINDINGS: Brain: Cerebral volume is stable and within normal limits. No midline shift, ventriculomegaly, mass effect, evidence of mass lesion, intracranial hemorrhage or evidence of cortically based acute infarction. Gray-white matter differentiation is within normal limits throughout the brain. Vascular: Generalized increase in density of the  vasculature compared to February. No suspicious intracranial vascular hyperdensity. Skull: No acute osseous abnormality identified. Sinuses/Orbits: Improved bilateral paranasal sinus aeration since February. Tympanic cavities and  mastoids appear clear. Other: Visualized orbits and scalp soft tissues are within normal limits. IMPRESSION: Stable and negative noncontrast CT appearance of the brain. Electronically Signed   By: VEAR Hurst M.D.   On: 06/14/2024 09:22    Pending Labs Unresulted Labs (From admission, onward)     Start     Ordered   06/14/24 1137  Urinalysis, w/ Reflex to Culture (Infection Suspected) -Urine, Clean Catch  (Urine Labs)  Once,   R       Question:  Specimen Source  Answer:  Urine, Clean Catch   06/14/24 1137   06/14/24 0821  Levetiracetam  level  Once,   URGENT        06/14/24 9178   Signed and Held  Comprehensive metabolic panel  Tomorrow morning,   R        Signed and Held   Signed and Held  CBC  Tomorrow morning,   R        Signed and Held   Signed and Held  Vitamin B12  Once,   R        Signed and Held   Signed and Held  Folate  Once,   R        Signed and Held   Signed and Held  TSH  Once,   R        Signed and Held   Signed and Held  T4, free  Once,   R        Signed and Held   Signed and Held  Urinalysis, w/ Reflex to Culture (Infection Suspected) -Urine, Clean Catch  (Urine Labs)  Once,   R       Question:  Specimen Source  Answer:  Urine, Clean Catch   Signed and Held            Vitals/Pain Today's Vitals   06/14/24 0830 06/14/24 0946 06/14/24 1045 06/14/24 1130  BP: 111/78  (!) 127/94 130/88  Pulse: (!) 107  (!) 103   Resp: 20  13 10   Temp:      TempSrc:      SpO2: 94%  97%   Weight:      Height:      PainSc:  0-No pain      Isolation Precautions No active isolations  Medications Medications  levETIRAcetam  (KEPPRA ) undiluted injection 1,000 mg (1,000 mg Intravenous Given 06/14/24 0834)    Mobility walks with person assist      Focused Assessments Neuro Assessment Handoff:  Swallow screen pass? Not completed     Last date known well: 06/14/24 Last time known well: 0300 Neuro Assessment: Exceptions to WDL Neuro Checks:      Has TPA been given? No If patient is a Neuro Trauma and patient is going to OR before floor call report to 4N Charge nurse: [phone] or [phone]   R Recommendations: See Admitting Provider Note  Report given to:   Additional Notes: Drowsy, follows commands, no observed seizures since arrival to ER, Keppra  IV given. CT head completed

## 2024-06-15 ENCOUNTER — Encounter (HOSPITAL_COMMUNITY)

## 2024-06-15 DIAGNOSIS — F1310 Sedative, hypnotic or anxiolytic abuse, uncomplicated: Secondary | ICD-10-CM | POA: Diagnosis present

## 2024-06-15 DIAGNOSIS — E872 Acidosis, unspecified: Secondary | ICD-10-CM | POA: Diagnosis present

## 2024-06-15 DIAGNOSIS — R569 Unspecified convulsions: Secondary | ICD-10-CM | POA: Diagnosis not present

## 2024-06-15 DIAGNOSIS — E162 Hypoglycemia, unspecified: Secondary | ICD-10-CM | POA: Diagnosis present

## 2024-06-15 DIAGNOSIS — Z91148 Patient's other noncompliance with medication regimen for other reason: Secondary | ICD-10-CM | POA: Diagnosis not present

## 2024-06-15 DIAGNOSIS — Z885 Allergy status to narcotic agent status: Secondary | ICD-10-CM | POA: Diagnosis not present

## 2024-06-15 DIAGNOSIS — Z79891 Long term (current) use of opiate analgesic: Secondary | ICD-10-CM | POA: Diagnosis not present

## 2024-06-15 DIAGNOSIS — G40901 Epilepsy, unspecified, not intractable, with status epilepticus: Secondary | ICD-10-CM | POA: Diagnosis present

## 2024-06-15 DIAGNOSIS — J690 Pneumonitis due to inhalation of food and vomit: Secondary | ICD-10-CM | POA: Diagnosis present

## 2024-06-15 DIAGNOSIS — D649 Anemia, unspecified: Secondary | ICD-10-CM | POA: Diagnosis present

## 2024-06-15 DIAGNOSIS — F1410 Cocaine abuse, uncomplicated: Secondary | ICD-10-CM | POA: Diagnosis present

## 2024-06-15 DIAGNOSIS — Z87891 Personal history of nicotine dependence: Secondary | ICD-10-CM | POA: Diagnosis not present

## 2024-06-15 DIAGNOSIS — I959 Hypotension, unspecified: Secondary | ICD-10-CM | POA: Diagnosis present

## 2024-06-15 DIAGNOSIS — Z79899 Other long term (current) drug therapy: Secondary | ICD-10-CM | POA: Diagnosis not present

## 2024-06-15 DIAGNOSIS — F32A Depression, unspecified: Secondary | ICD-10-CM | POA: Diagnosis present

## 2024-06-15 DIAGNOSIS — E538 Deficiency of other specified B group vitamins: Secondary | ICD-10-CM | POA: Diagnosis present

## 2024-06-15 LAB — GLUCOSE, CAPILLARY
Glucose-Capillary: 107 mg/dL — ABNORMAL HIGH (ref 70–99)
Glucose-Capillary: 114 mg/dL — ABNORMAL HIGH (ref 70–99)
Glucose-Capillary: 85 mg/dL (ref 70–99)
Glucose-Capillary: 83 mg/dL (ref 70–99)
Glucose-Capillary: 76 mg/dL (ref 70–99)
Glucose-Capillary: 91 mg/dL (ref 70–99)

## 2024-06-15 LAB — COMPREHENSIVE METABOLIC PANEL WITH GFR
ALT: 17 U/L (ref 0–44)
AST: 19 U/L (ref 15–41)
Albumin: 3.8 g/dL (ref 3.5–5.0)
Alkaline Phosphatase: 58 U/L (ref 38–126)
Anion gap: 9 (ref 5–15)
BUN: 15 mg/dL (ref 6–20)
CO2: 24 mmol/L (ref 22–32)
Calcium: 8.9 mg/dL (ref 8.9–10.3)
Chloride: 103 mmol/L (ref 98–111)
Creatinine, Ser: 0.92 mg/dL (ref 0.61–1.24)
GFR, Estimated: >60 mL/min (ref >60)
Glucose, Bld: 108 mg/dL — ABNORMAL HIGH (ref 70–99)
Potassium: 3.3 mmol/L — ABNORMAL LOW (ref 3.5–5.1)
Sodium: 136 mmol/L (ref 135–145)
Total Bilirubin: 1.6 mg/dL — ABNORMAL HIGH (ref 0.0–1.2)
Total Protein: 7.4 g/dL (ref 6.5–8.1)

## 2024-06-15 LAB — CBC
HCT: 39.6 % (ref 39.0–52.0)
Hemoglobin: 14 g/dL (ref 13.0–17.0)
MCH: 28.9 pg (ref 26.0–34.0)
MCHC: 35.4 g/dL (ref 30.0–36.0)
MCV: 81.8 fL (ref 80.0–100.0)
Platelets: 286 10*3/uL (ref 150–400)
RBC: 4.84 MIL/uL (ref 4.22–5.81)
RDW: 12.7 % (ref 11.5–15.5)
WBC: 11.5 10*3/uL — ABNORMAL HIGH (ref 4.0–10.5)
nRBC: 0 % (ref 0.0–0.2)

## 2024-06-15 LAB — URINALYSIS, W/ REFLEX TO CULTURE (INFECTION SUSPECTED)
Bilirubin Urine: NEGATIVE
Glucose, UA: NEGATIVE mg/dL
Hgb urine dipstick: NEGATIVE
Ketones, ur: 5 mg/dL — AB
Leukocytes,Ua: NEGATIVE
Nitrite: NEGATIVE
Protein, ur: 30 mg/dL — AB
Specific Gravity, Urine: 1.027 (ref 1.005–1.030)
pH: 5 (ref 5.0–8.0)

## 2024-06-15 LAB — MRSA NEXT GEN BY PCR, NASAL
MRSA by PCR Next Gen: NOT DETECTED
MRSA by PCR Next Gen: NOT DETECTED

## 2024-06-15 MED ORDER — KETOROLAC TROMETHAMINE 15 MG/ML IJ SOLN: 15.0000 mg | Freq: Four times a day (QID) | INTRAMUSCULAR | Status: DC | PRN

## 2024-06-15 MED ORDER — KETOROLAC TROMETHAMINE 15 MG/ML IJ SOLN
15.0000 mg | Freq: Four times a day (QID) | INTRAMUSCULAR | Status: DC | PRN
  Administered 2024-06-15 – 2024-06-16 (×3): 15 mg via INTRAVENOUS
  Filled 2024-06-15 (×3): qty 1

## 2024-06-15 MED ORDER — HYDROMORPHONE HCL 1 MG/ML IJ SOLN
1.0000 mg | INTRAMUSCULAR | Status: DC | PRN
  Administered 2024-06-16: 1 mg via INTRAVENOUS
  Filled 2024-06-15: qty 1

## 2024-06-15 NOTE — Plan of Care (Signed)
  Problem: Clinical Measurements: Goal: Ability to maintain clinical measurements within normal limits will improve Outcome: Progressing Goal: Respiratory complications will improve Outcome: Progressing   Problem: Activity: Goal: Risk for activity intolerance will decrease Outcome: Progressing   Problem: Coping: Goal: Level of anxiety will decrease Outcome: Progressing

## 2024-06-15 NOTE — Progress Notes (Signed)
 LTM EEG D/C'd. No noted skin break down. [facility] notified.

## 2024-06-15 NOTE — Progress Notes (Addendum)
 NEUROLOGY CONSULT FOLLOW UP NOTE   Date of service: June 15, 2024 Patient Name: Jason [name] MRN:  [id number] DOB:  January 03, 1976  Interval Hx/subjective  Patient has remained hemodynamically stable with blood pressures on the low end.  Tmax of 100.  No seizures on overnight EEG  Vitals   Vitals:   06/15/24 0630 06/15/24 0700 06/15/24 0730 06/15/24 0800  BP: 94/66 100/68 107/66 102/70  Pulse: 83 80 78 81  Resp: 13 15 13 13   Temp:    98.3 F (36.8 C)  TempSrc:    Axillary  SpO2: 95% 98% 98% 98%  Weight:      Height:         Body mass index is 26.45 kg/m.  Physical Exam   Constitutional: Appears well-developed and well-nourished.  Psych: Affect appropriate to situation.  Eyes: No scleral injection.  HENT: No OP obstrucion.  Poor dentition Head: Normocephalic.  Cardiovascular: Normal rate and regular rhythm.  Respiratory: Effort normal, non-labored breathing.  Skin: WDI.   Neurologic Examination    NEURO:  Mental Status: Very drowsy but responsive to voice and touch, oriented to person and place but not time or situation Speech/Language: speech is with mild dysarthria but no aphasia  Cranial Nerves:  II: PERRL.  III, IV, VI: EOMI. Eyelids elevate symmetrically.  V: Sensation is intact to light touch and symmetrical to face.  VII: Smile is symmetrical.  VIII: hearing intact to voice. IX, X: Voice is mildly dysarthric KP:Dynloizm shrug 5/5. XII: tongue is midline without fasciculations. Motor: Able to move all 4 extremities with good antigravity strength Tone: is normal and bulk is normal Sensation- Intact to light touch bilaterally.   Coordination: Questionable ataxia in finger-to-nose bilaterally Gait- deferred   Medications  Current Facility-Administered Medications:    acetaminophen  (TYLENOL ) tablet 650 mg, 650 mg, Oral, Q4H PRN, Adolph, Lauren E, PA-C   Ampicillin-Sulbactam (UNASYN) 3 g in sodium chloride  0.9 % 100 mL IVPB, 3 g, Intravenous, Q6H, Tat,  Alm, MD, Stopped at 06/15/24 0601   Chlorhexidine  Gluconate Cloth 2 % PADS 6 each, 6 each, Topical, Q0600, Tat, David, MD   cyanocobalamin (VITAMIN B12) tablet 1,000 mcg, 1,000 mcg, Oral, Daily, Bhagat, Srishti L, MD   docusate sodium  (COLACE) capsule 100 mg, 100 mg, Oral, BID PRN, Adolph Tinnie BRAVO, PA-C   enoxaparin  (LOVENOX ) injection 40 mg, 40 mg, Subcutaneous, Q24H, Autry, Lauren E, PA-C, 40 mg at 06/14/24 2153   lactated ringers infusion, , Intravenous, Continuous, Autry, Lauren E, PA-C, Last Rate: 50 mL/hr at 06/15/24 9287, Infusion Verify at 06/15/24 9287   levETIRAcetam  (KEPPRA ) undiluted injection 2,000 mg, 2,000 mg, Intravenous, Q12H, Bhagat, Srishti L, MD, 2,000 mg at 06/15/24 9273   ondansetron  (ZOFRAN ) injection 4 mg, 4 mg, Intravenous, Q6H PRN, Autry, Lauren E, PA-C   Oral care mouth rinse, 15 mL, Mouth Rinse, PRN, Maree Harder, MD   polyethylene glycol (MIRALAX  / GLYCOLAX ) packet 17 g, 17 g, Oral, Daily PRN, Adolph Tinnie BRAVO, PA-C  Labs and Diagnostic Imaging   CBC:  Recent Labs  Lab 06/14/24 0820  WBC 12.3*  NEUTROABS 10.7*  HGB 15.2  HCT 42.3  MCV 82.5  PLT 298    Basic Metabolic Panel:  Lab Results  Component Value Date   NA 137 06/14/2024   K 4.0 06/14/2024   CO2 18 (L) 06/14/2024   GLUCOSE 144 (H) 06/14/2024   BUN 14 06/14/2024   CREATININE 1.04 06/14/2024   CALCIUM 10.0 06/14/2024   GFRNONAA >60 06/14/2024  GFRAA  07/14/2007    >60        The eGFR has been calculated using the MDRD equation. This calculation has not been validated in all clinical   Lipid Panel: No results found for: LDLCALC HgbA1c: No results found for: HGBA1C Urine Drug Screen:     Component Value Date/Time   LABOPIA NONE DETECTED 06/14/2024 0900   COCAINSCRNUR POSITIVE (A) 06/14/2024 0900   LABBENZ POSITIVE (A) 06/14/2024 0900   AMPHETMU NONE DETECTED 06/14/2024 0900   THCU NONE DETECTED 06/14/2024 0900   LABBARB NONE DETECTED 06/14/2024 0900    Alcohol Level      Component Value Date/Time   ETH <15 06/14/2024 0820    CT Head without contrast(Personally reviewed): No acute abnormality   Continuous EEG 6/28 - 6/29:  No seizures.  Shows polyspikes consistent with known history of seizures  Assessment   Jason [name] is a 48 y.o. male with history of epilepsy, viral encephalitis and substance abuse to include cocaine .  He takes Keppra  at home and did miss a dose the night prior to admission.  He had 1 seizure at home in the early morning of 6/28 and declined to go to the hospital.  He then had another seizure later in the morning and was taken to [facility] and transferred here for long-term EEG monitoring.  UDS was positive for cocaine .  Breakthrough seizures likely occurred in the setting of cocaine  abuse and missed dose of Keppra .  Patient continues to be quite drowsy, and this could be due to prolonged postictal state as well as drug use. LTM EEG negative for seizures  Impression: Breakthrough seizure in the setting of possible noncompliance as well as drug use  Recommendations  -Continue Keppra  2000 mg twice daily - Discontinue LTM EEG -Was given a one-time load of phenobarbital-would not continue phenobarbital at this time.  He needs to be compliant to his medications to avoid further breakthroughs. - Continue 1000 mcg B12 daily - Neurology will sign off. ______________________________________________________________________  Patient seen by NP and then by MD, MD to edit note as needed.  Signed, Cortney E Everitt Clint Kill, NP [facility]   Attending Neurohospitalist Addendum Patient seen and examined with APP/Resident. Agree with the history and physical as documented above. Agree with the plan as documented, which I helped formulate. I have independently reviewed the chart, obtained history, review of systems and examined the patient.I have personally reviewed pertinent head/neck/spine imaging (CT/MRI). Plan discussed with Dr.  Neda Please feel free to call with any questions.  -- Eligio Lav, MD Neurologist [facility] Pager: [phone]

## 2024-06-15 NOTE — Progress Notes (Signed)
 LTM EEG hooked up and running - no initial skin breakdown - push button tested - [facility] monitoring.

## 2024-06-15 NOTE — Progress Notes (Signed)
 eLink Physician-Brief Progress Note Patient Name: Jason [name] DOB: [birth year] MRN: [id number]   Date of Service  06/15/2024  HPI/Events of Note  48 year old male with past medical history of seizures who presented to [facility] on 6/28 with seizures found to be in status epilepticus.   Complaining of generalized pain, on chronic opiate therapy at home with oxycodone  10 mg  eICU Interventions  Add sliding scale ketorolac /Dilaudid given n.p.o. status   0311 -  took Tramadol 15 mg and Dilaudid 1.0 and claims ineffective. Add 1x dilaudid breakthrough  0323 -has been slightly more hypotensive since the use of analgesics.  Borderline blood pressure readings with a MAP of 67.  Also some operative neck.  No additional opiates are indicated.  Intervention Category Intermediate Interventions: Pain - evaluation and management  Zamier Eggebrecht 06/15/2024, 9:38 PM

## 2024-06-15 NOTE — Procedures (Addendum)
 Patient Name: Jason [name]  MRN: [id number]  Epilepsy Attending: Arlin MALVA Krebs  Referring Physician/Provider: Jerrie Lola CROME, MD  Duration: 06/15/2024 0049 to 1120  Patient history: 48 y.o. male with a past medical history significant for epilepsy with provoked seizures in the setting of cocaine  abuse.  EEG to evaluate for seizure  Level of alertness: comatose/ lethargic   AEDs during EEG study: LEV, Ativan   Technical aspects: This EEG study was done with scalp electrodes positioned according to the 10-20 International system of electrode placement. Electrical activity was reviewed with band pass filter of 1-70Hz , sensitivity of 7 uV/mm, display speed of 81mm/sec with a 60Hz  notched filter applied as appropriate. EEG data were recorded continuously and digitally stored.  Video monitoring was available and reviewed as appropriate.  Description: EEG showed continuous generalized 3 to 6 Hz theta-delta slowing admixed with 15 to 18 Hz, 2-3 uV beta activity distributed symmetrically and diffusely. Generalized polyspikes were also noted.  Hyperventilation and photic stimulation were not performed.     ABNORMALITY -Polyspikes, generalized - Continuous slow, generalized  IMPRESSION: This study is consistent with patient's history of generalized epilepsy.  Additionally there moderate to severe diffuse encephalopathy. No seizures were seen throughout the recording.  Jason [name]

## 2024-06-15 NOTE — Progress Notes (Signed)
 SLP Cancellation Note  Patient Details Name: ADON GEHLHAUSEN MRN: [id number] DOB: [birth year]   Cancelled treatment:        Order for swallow evaluation received and appreciated. Pt somnolent per chart review.  Spoke with RN via secure chat.  Pt with decreased LOA and not appropriate for swallow evaluation at this time.  SLP will follow for medical readiness for PO trials.    Anette FORBES Grippe, MA, CCC-SLP Acute Rehabilitation Services Office: [phone] 06/15/2024, 6:31 PM

## 2024-06-15 NOTE — Progress Notes (Signed)
 NAME:  Jason [name], MRN:  [id number], DOB:  Feb 29, 1976, LOS: 1 ADMISSION DATE:  06/14/2024, CONSULTATION DATE: 06/14/2024 REFERRING MD: Hospitalist, CHIEF COMPLAINT: Status epilepticus  History of Present Illness:  48 year old male with past medical history of seizures who presented to [facility] on 6/28 with seizures. Information obtained from wife in ED who reported seizure around 0230. He returned to baseline and was able to take his Keppra  dose around 0430. He then had second tonic-clonic seizure at 0730. Notes total of 2 seizures in the past two years prior to this. Labs in ED notable for WBC 12.3, glucose 128, ethanol negative, UA negative, UDS + cocaine , benzos. CT head negative. Given 1g Keppra  load. He failed to return to baseline after period of observation in ED and was admitted for observation. Neurology was also consulted. Later today (6/28) had 3rd tonic-clonic seizure on floor lasting about 2.5 minutes. Neurology was paged and recommended phenobarbital load. He was transferred to [facility] ICU with plan to transfer to [facility] for ongoing evaluation.    On my exam on admission, wife is at bedside. She states he has been depressed and in bed for the past week not eating much. States last night around 1830, he went to have BM and came back diffusely sweating which she states has not happened before.     Pertinent  Medical History  seizures  Significant Hospital Events: Including procedures, antibiotic start and stop dates in addition to other pertinent events   6/28: [facility] for x2 seizure at home>admitted>seized>phenobarb> ICU>request to transfer to [facility] for ongoing eval and management   Interim History / Subjective:  Responds to commands Knows the date, knows its location Still somnolent  Objective    Blood pressure 102/70, pulse 81, temperature 99.2 F (37.3 C), temperature source Axillary, resp. rate 13, height 5' 8 (1.727 m), weight 78.9 kg, SpO2 98%.        Intake/Output Summary (Last  24 hours) at 06/15/2024 0817 Last data filed at 06/15/2024 9287 Gross per 24 hour  Intake 770.7 ml  Output 1350 ml  Net -579.3 ml   Filed Weights   06/14/24 1814 06/14/24 2030 06/15/24 0500  Weight: 80.8 kg 70.5 kg 78.9 kg    Examination: General: Middle-age, does not appear to be in distress HENT: Moist oral mucosa Lungs: Clear breath sounds Cardiovascular: S1-S2 appreciated Abdomen: Soft, bowel sounds appreciated Extremities: No clubbing, no edema Neuro: Easily arousable, somnolent, moving all extremities GU:   I reviewed last 24 h vitals and pain scores, last 48 h intake and output, last 24 h labs and trends, and last 24 h imaging results.  Resolved problem list   Assessment and Plan   Status epilepticus History of seizures - Last seizure about 1800 hrs. - Had 3 seizures with no clear return to baseline - Received 1 g Keppra  load - Received phenobarb load after third seizure - Neuroprotective measures- normothermia, euglycemia, HOB greater than 30, head in neutral alignment, normocapnia, normoxia - Appreciate neurology following - As needed benzodiazepines for seizures - Currently ordered Keppra  2 g every 12  Aspiration event - Continue on Unasyn, can be stopped after 72 hours if no evidence of ongoing infection - N.p.o. at present until mental status improves  Polysubstance abuse with UDS showing benzos, cocaine  - Cessation counseling when appropriate  Best Practice (right click and Reselect all SmartList Selections daily)   Diet/type: NPO DVT prophylaxis LMWH Pressure ulcer(s): N/A GI prophylaxis: N/A Lines: N/A Foley:  N/A Code Status:  full code Last date of multidisciplinary goals of care discussion [discussed with spouse]  Labs   CBC: Recent Labs  Lab 06/14/24 0820  WBC 12.3*  NEUTROABS 10.7*  HGB 15.2  HCT 42.3  MCV 82.5  PLT 298    Basic Metabolic Panel: Recent Labs  Lab 06/14/24 0820 06/14/24 2021  NA 138 137  K 3.7 4.0  CL 103  104  CO2 22 18*  GLUCOSE 128* 144*  BUN 17 14  CREATININE 0.93 1.04  CALCIUM 9.8 10.0  MG  --  2.5*   GFR: Estimated Creatinine Clearance: 85 mL/min (by C-G formula based on SCr of 1.04 mg/dL). Recent Labs  Lab 06/14/24 0820  WBC 12.3*    Liver Function Tests: Recent Labs  Lab 06/14/24 0820  AST 35  ALT 26  ALKPHOS 77  BILITOT 0.9  PROT 8.4*  ALBUMIN 4.2   No results for input(s): LIPASE, AMYLASE in the last 168 hours. No results for input(s): AMMONIA in the last 168 hours.  ABG No results found for: PHART, PCO2ART, PO2ART, HCO3, TCO2, ACIDBASEDEF, O2SAT   Coagulation Profile: No results for input(s): INR, PROTIME in the last 168 hours.  Cardiac Enzymes: No results for input(s): CKTOTAL, CKMB, CKMBINDEX, TROPONINI in the last 168 hours.  HbA1C: No results found for: HGBA1C  CBG: Recent Labs  Lab 06/14/24 1859 06/14/24 2021 06/14/24 2306 06/15/24 0327 06/15/24 0721  GLUCAP 173* 133* 115* 107* 114*    Review of Systems:   No significant ongoing pain or discomfort  Past Medical History:  He,  has a past medical history of Seizures (HCC).   Surgical History:   Past Surgical History:  Procedure Laterality Date   TONSILLECTOMY      Social History:   reports that he has never smoked. He does not have any smokeless tobacco history on file. He reports that he does not drink alcohol and does not use drugs.   Family History:  His family history is not on file.   Allergies Allergies  Allergen Reactions   Hydrocodone-Acetaminophen  Nausea And Vomiting    The patient is critically ill with multiple organ systems failure and requires high complexity decision making for assessment and support, frequent evaluation and titration of therapies, application of advanced monitoring technologies and extensive interpretation of multiple databases. Critical Care Time devoted to patient care services described in this note independent  of APP/resident time (if applicable)  is 32 minutes.   Jennet Epley MD [facility] Personal pager: See Amion If unanswered, please page CCM On-call: #[phone]

## 2024-06-16 LAB — BASIC METABOLIC PANEL WITH GFR
Anion gap: 11 (ref 5–15)
BUN: 21 mg/dL — ABNORMAL HIGH (ref 6–20)
CO2: 22 mmol/L (ref 22–32)
Calcium: 9.1 mg/dL (ref 8.9–10.3)
Chloride: 105 mmol/L (ref 98–111)
Creatinine, Ser: 1 mg/dL (ref 0.61–1.24)
GFR, Estimated: >60 mL/min (ref >60)
Glucose, Bld: 78 mg/dL (ref 70–99)
Potassium: 4 mmol/L (ref 3.5–5.1)
Sodium: 138 mmol/L (ref 135–145)

## 2024-06-16 LAB — CK: Total CK: 28 U/L — ABNORMAL LOW (ref 49–397)

## 2024-06-16 LAB — CBC
HCT: 38.4 % — ABNORMAL LOW (ref 39.0–52.0)
Hemoglobin: 13.2 g/dL (ref 13.0–17.0)
MCH: 29.2 pg (ref 26.0–34.0)
MCHC: 34.4 g/dL (ref 30.0–36.0)
MCV: 85 fL (ref 80.0–100.0)
Platelets: 242 10*3/uL (ref 150–400)
RBC: 4.52 MIL/uL (ref 4.22–5.81)
RDW: 12.8 % (ref 11.5–15.5)
WBC: 7.8 10*3/uL (ref 4.0–10.5)
nRBC: 0 % (ref 0.0–0.2)

## 2024-06-16 LAB — HEMOGLOBIN A1C
Hgb A1c MFr Bld: 5 % (ref 4.8–5.6)
Mean Plasma Glucose: 96.8 mg/dL

## 2024-06-16 LAB — GLUCOSE, CAPILLARY
Glucose-Capillary: 81 mg/dL (ref 70–99)
Glucose-Capillary: 69 mg/dL — ABNORMAL LOW (ref 70–99)
Glucose-Capillary: 108 mg/dL — ABNORMAL HIGH (ref 70–99)
Glucose-Capillary: 61 mg/dL — ABNORMAL LOW (ref 70–99)
Glucose-Capillary: 78 mg/dL (ref 70–99)
Glucose-Capillary: 112 mg/dL — ABNORMAL HIGH (ref 70–99)
Glucose-Capillary: 103 mg/dL — ABNORMAL HIGH (ref 70–99)

## 2024-06-16 LAB — PHOSPHORUS: Phosphorus: 3.4 mg/dL (ref 2.5–4.6)

## 2024-06-16 LAB — MAGNESIUM: Magnesium: 2.3 mg/dL (ref 1.7–2.4)

## 2024-06-16 LAB — LACTIC ACID, PLASMA
Lactic Acid, Venous: 3.9 mmol/L (ref 0.5–1.9)
Lactic Acid, Venous: 2.3 mmol/L (ref 0.5–1.9)

## 2024-06-16 LAB — LEVETIRACETAM LEVEL: Levetiracetam Lvl: 62.7 ug/mL — ABNORMAL HIGH (ref 10.0–40.0)

## 2024-06-16 MED ORDER — CYANOCOBALAMIN 1000 MCG/ML IJ SOLN
1000.0000 ug | Freq: Once | INTRAMUSCULAR | Status: AC
  Administered 2024-06-16: 1000 ug via INTRAMUSCULAR
  Filled 2024-06-16: qty 1

## 2024-06-16 MED ORDER — LACTATED RINGERS IV BOLUS
1000.0000 mL | Freq: Once | INTRAVENOUS | Status: AC
  Administered 2024-06-16: 1000 mL via INTRAVENOUS

## 2024-06-16 MED ORDER — LACTATED RINGERS IV SOLN: INTRAVENOUS | Status: AC

## 2024-06-16 MED ORDER — HYDROMORPHONE HCL 1 MG/ML IJ SOLN
0.5000 mg | Freq: Once | INTRAMUSCULAR | Status: AC
  Administered 2024-06-16: 0.5 mg via INTRAVENOUS
  Filled 2024-06-16: qty 1

## 2024-06-16 MED ORDER — HYDROMORPHONE HCL 1 MG/ML IJ SOLN: 0.5000 mg | Freq: Four times a day (QID) | INTRAMUSCULAR | Status: DC | PRN

## 2024-06-16 MED ORDER — HYDROMORPHONE HCL 1 MG/ML IJ SOLN
0.5000 mg | INTRAMUSCULAR | Status: DC | PRN
  Administered 2024-06-16 – 2024-06-17 (×5): 0.5 mg via INTRAVENOUS
  Filled 2024-06-16 (×2): qty 1
  Filled 2024-06-16: qty 0.5
  Filled 2024-06-16: qty 1
  Filled 2024-06-16: qty 0.5

## 2024-06-16 MED ORDER — PIPERACILLIN-TAZOBACTAM 3.375 G IVPB
3.3750 g | Freq: Three times a day (TID) | INTRAVENOUS | Status: DC
  Administered 2024-06-16 – 2024-06-18 (×6): 3.375 g via INTRAVENOUS
  Filled 2024-06-16 (×9): qty 50

## 2024-06-16 MED ORDER — HYDROMORPHONE HCL 1 MG/ML IJ SOLN: 1.0000 mg | Freq: Once | INTRAMUSCULAR | Status: DC

## 2024-06-16 NOTE — Progress Notes (Signed)
 Patient's blood pressure low at 0300 check, rechecked with an appropriate MAP, but low SBP. eLink called and eLink MD said this was fine. eLink MD said If we're persistently sustaining in 70's despite stimulation, Then we can try a fluid bolus, but I believe this is transient. Instructed to call back with any issues later.

## 2024-06-16 NOTE — Progress Notes (Signed)
 Hypoglycemic event: CBG 61. Hypoglycemia protocol and standing orders placed and followed  Repeat CBG:73

## 2024-06-16 NOTE — Progress Notes (Signed)
 Hypoglycemic event noted: CBG 69. Patient currently eating lunch. Standing orders placed and followed.  Recheck CBG 1300: 108

## 2024-06-16 NOTE — Progress Notes (Signed)
 NAME:  Jason [name], MRN:  [id number], DOB:  [birth year], LOS: 1 ADMISSION DATE:  06/14/2024, CONSULTATION DATE: 06/14/2024 REFERRING MD: Hospitalist, CHIEF COMPLAINT: Status epilepticus  History of Present Illness:  48 year old male with past medical history of seizures who presented to [facility] on 6/28 with seizures. Information obtained from wife in ED who reported seizure around 0230. He returned to baseline and was able to take his Keppra  dose around 0430. He then had second tonic-clonic seizure at 0730. Notes total of 2 seizures in the past two years prior to this. Labs in ED notable for WBC 12.3, glucose 128, ethanol negative, UA negative, UDS + cocaine , benzos. CT head negative. Given 1g Keppra  load. He failed to return to baseline after period of observation in ED and was admitted for observation. Neurology was also consulted. Later today (6/28) had 3rd tonic-clonic seizure on floor lasting about 2.5 minutes. Neurology was paged and recommended phenobarbital load. He was transferred to [facility] ICU with plan to transfer to [facility] for ongoing evaluation.    On my exam on admission, wife is at bedside. She states he has been depressed and in bed for the past week not eating much. States last night around 1830, he went to have BM and came back diffusely sweating which she states has not happened before.     Pertinent  Medical History  seizures  Significant Hospital Events: Including procedures, antibiotic start and stop dates in addition to other pertinent events   6/28: [facility] for x2 seizure at home>admitted>seized>phenobarb> ICU>request to transfer to [facility] for ongoing eval and management  Mentation slightly improved. Pending swallow eval.   Interim History / Subjective:  Responds to commands Knows name.  Decreased urine output.   Objective    Blood pressure 95/70, pulse 75, temperature 97.6 F (36.4 C), temperature source Oral, resp. rate 10, height 5' 8 (1.727 m), weight 70.3 kg, SpO2 95%.     FiO2 (%):  [28 %] 28 %   Intake/Output Summary (Last 24 hours) at 06/16/2024 0743 Last data filed at 06/16/2024 0700 Gross per 24 hour  Intake 1148.55 ml  Output 200 ml  Net 948.55 ml   Filed Weights   06/14/24 2030 06/15/24 0500 06/16/24 0500  Weight: 70.5 kg 78.9 kg 70.3 kg    Examination: General: Middle-age, does not appear to be in distress HENT: pupils equal  Lungs: Clear breath sounds Cardiovascular: S1-S2 appreciated Abdomen: Soft, bowel sounds appreciated Extremities: No clubbing, no edema Neuro: Easily arousable, somnolent, moving all extremities GU:   I reviewed last 24 h vitals and pain scores, last 48 h intake and output, last 24 h labs and trends, and last 24 h imaging results.  Resolved problem list   Assessment and Plan   Status epilepticus History of seizures - Last seizure about 1800 hrs 6/28 - Had 3 seizures with no clear return to baseline - Received 1 g Keppra  load, continue keppra  2g q12 - Received phenobarb load after third seizure - Neuroprotective measures- normothermia, euglycemia, HOB greater than 30, head in neutral alignment, normocapnia, normoxia - Appreciate neurology following - As needed benzodiazepines for seizures - b12 281, obtain MMA, homocysteine.  - start IM repletion, followed by PO  Aspiration event - Continue on Unasyn, can be stopped after 72 hours if no evidence of ongoing infection - N.p.o. at present until cleared by SLP   Polysubstance abuse with UDS showing benzos, cocaine  - Cessation counseling when appropriate  Best Practice (right click and Reselect all SmartList  Selections daily)   Diet/type: NPO DVT prophylaxis LMWH Pressure ulcer(s): N/A GI prophylaxis: N/A Lines: N/A Foley:  N/A Code Status:  full code Last date of multidisciplinary goals of care discussion [discussed with spouse]  Labs   CBC: Recent Labs  Lab 06/14/24 0820  WBC 12.3*  NEUTROABS 10.7*  HGB 15.2  HCT 42.3  MCV 82.5  PLT 298     Basic Metabolic Panel: Recent Labs  Lab 06/14/24 0820 06/14/24 2021  NA 138 137  K 3.7 4.0  CL 103 104  CO2 22 18*  GLUCOSE 128* 144*  BUN 17 14  CREATININE 0.93 1.04  CALCIUM 9.8 10.0  MG  --  2.5*   GFR: Estimated Creatinine Clearance: 85 mL/min (by C-G formula based on SCr of 1.04 mg/dL). Recent Labs  Lab 06/14/24 0820  WBC 12.3*    Liver Function Tests: Recent Labs  Lab 06/14/24 0820  AST 35  ALT 26  ALKPHOS 77  BILITOT 0.9  PROT 8.4*  ALBUMIN 4.2   No results for input(s): LIPASE, AMYLASE in the last 168 hours. No results for input(s): AMMONIA in the last 168 hours.  ABG No results found for: PHART, PCO2ART, PO2ART, HCO3, TCO2, ACIDBASEDEF, O2SAT   Coagulation Profile: No results for input(s): INR, PROTIME in the last 168 hours.  Cardiac Enzymes: No results for input(s): CKTOTAL, CKMB, CKMBINDEX, TROPONINI in the last 168 hours.  HbA1C: No results found for: HGBA1C  CBG: Recent Labs  Lab 06/15/24 1139 06/15/24 1534 06/15/24 1920 06/15/24 2317 06/16/24 0313  GLUCAP 85 83 76 91 81    Review of Systems:   No significant ongoing pain or discomfort  Past Medical History:  He,  has a past medical history of Seizures (HCC).   Surgical History:   Past Surgical History:  Procedure Laterality Date   TONSILLECTOMY      Social History:   reports that he has never smoked. He does not have any smokeless tobacco history on file. He reports that he does not drink alcohol and does not use drugs.   Family History:  His family history is not on file.   Allergies Allergies  Allergen Reactions   Hydrocodone-Acetaminophen  Nausea And Vomiting    The patient is critically ill with multiple organ systems failure and requires high complexity decision making for assessment and support, frequent evaluation and titration of therapies, application of advanced monitoring technologies and extensive interpretation of  multiple databases. Critical Care Time devoted to patient care services described in this note independent of APP/resident time (if applicable)  is 32 minutes.   Kevin Legions MD [facility] CCM On-call: [phone]

## 2024-06-16 NOTE — Evaluation (Signed)
 Clinical/Bedside Swallow Evaluation Patient Details  Name: Jason [name] MRN: [id number] Date of Birth: [birth year]  Today's Date: 06/16/2024 Time: SLP Start Time (ACUTE ONLY): 9063 SLP Stop Time (ACUTE ONLY): 1004 SLP Time Calculation (min) (ACUTE ONLY): 28 min  Past Medical History:  Past Medical History:  Diagnosis Date   Seizures (HCC)    Past Surgical History:  Past Surgical History:  Procedure Laterality Date   TONSILLECTOMY     HPI:  Jason [name] is a 48 yo male presenting to [facility] ED 6/28 after having two seizures. UDS + cocaine , benzos. CTH negative. Failed to return to baseline in ED and later had a third tonic-clonic seizure lasting ~2.5 minutes; transferred to [facility] and neurology recommended phenobarbital load. PMH includes seizures    Assessment / Plan / Recommendation  Clinical Impression  Pt is lethargic but did maintain arousal for PO trials given extensive stimuli. He completed oral care independently, removing thick secretions from his oral cavity. Pt is edentulous and states he does not typically wear dentures but eats regular solids. Immediate coughing occurred x1 after sips of thin liquids but this did not recur with subsequent sips or during the 3 oz water test. He fed himself purees and solids with prompt initiation of oral transit and complete oral clearance, independently using a liquid wash as needed. Recommend Dys 3 solids with thin liquids. Suspect pt will benefit from full supervision initially to ensure he is adequately alert. Pt also verbalized understanding of only eating/drinking when fully awake and using a slow rate. SLP will continue following. SLP Visit Diagnosis: Dysphagia, unspecified (R13.10)    Aspiration Risk  Mild aspiration risk    Diet Recommendation Dysphagia 3 (Mech soft);Thin liquid    Liquid Administration via: Cup;Straw Medication Administration: Whole meds with liquid Supervision: Patient able to self feed;Intermittent supervision to  cue for compensatory strategies Compensations: Minimize environmental distractions;Slow rate;Small sips/bites;Follow solids with liquid Postural Changes: Seated upright at 90 degrees    Other  Recommendations Oral Care Recommendations: Oral care BID     Assistance Recommended at Discharge    Functional Status Assessment Patient has had a recent decline in their functional status and demonstrates the ability to make significant improvements in function in a reasonable and predictable amount of time.  Frequency and Duration min 2x/week  2 weeks       Prognosis Prognosis for improved oropharyngeal function: Good Barriers to Reach Goals: Cognitive deficits      Swallow Study   General HPI: Jason [name] is a 48 yo male presenting to [facility] ED 6/28 after having two seizures. UDS + cocaine , benzos. CTH negative. Failed to return to baseline in ED and later had a third tonic-clonic seizure lasting ~2.5 minutes; transferred to [facility] and neurology recommended phenobarbital load. PMH includes seizures Type of Study: Bedside Swallow Evaluation Previous Swallow Assessment: none in chart Diet Prior to this Study: NPO Temperature Spikes Noted: No Respiratory Status: Nasal cannula History of Recent Intubation: No Behavior/Cognition: Alert;Cooperative Oral Cavity Assessment: Within Functional Limits Oral Care Completed by SLP: Yes Oral Cavity - Dentition: Edentulous Vision: Functional for self-feeding Self-Feeding Abilities: Able to feed self Patient Positioning: Upright in bed Baseline Vocal Quality: Low vocal intensity Volitional Cough: Strong Volitional Swallow: Able to elicit    Oral/Motor/Sensory Function Overall Oral Motor/Sensory Function: Within functional limits   Ice Chips Ice chips: Not tested   Thin Liquid Thin Liquid: Impaired Presentation: Straw;Self Fed Pharyngeal  Phase Impairments: Cough - Immediate    Nectar  Thick Nectar Thick Liquid: Not tested   Honey Thick Honey Thick  Liquid: Not tested   Puree Puree: Within functional limits Presentation: Spoon;Self Fed   Solid     Solid: Within functional limits Presentation: Self Fed      Damien Blumenthal, M.A., CCC-SLP Speech Language Pathology, Acute Rehabilitation Services  Secure Chat preferred [phone]  06/16/2024,10:56 AM

## 2024-06-17 DIAGNOSIS — R569 Unspecified convulsions: Secondary | ICD-10-CM | POA: Diagnosis not present

## 2024-06-17 DIAGNOSIS — I959 Hypotension, unspecified: Secondary | ICD-10-CM

## 2024-06-17 DIAGNOSIS — E162 Hypoglycemia, unspecified: Secondary | ICD-10-CM

## 2024-06-17 LAB — GLUCOSE, CAPILLARY
Glucose-Capillary: 116 mg/dL — ABNORMAL HIGH (ref 70–99)
Glucose-Capillary: 111 mg/dL — ABNORMAL HIGH (ref 70–99)
Glucose-Capillary: 121 mg/dL — ABNORMAL HIGH (ref 70–99)
Glucose-Capillary: 165 mg/dL — ABNORMAL HIGH (ref 70–99)
Glucose-Capillary: 139 mg/dL — ABNORMAL HIGH (ref 70–99)

## 2024-06-17 LAB — COMPREHENSIVE METABOLIC PANEL WITH GFR
ALT: 18 U/L (ref 0–44)
AST: 24 U/L (ref 15–41)
Albumin: 2.9 g/dL — ABNORMAL LOW (ref 3.5–5.0)
Alkaline Phosphatase: 48 U/L (ref 38–126)
Anion gap: 8 (ref 5–15)
BUN: 13 mg/dL (ref 6–20)
CO2: 28 mmol/L (ref 22–32)
Calcium: 8.4 mg/dL — ABNORMAL LOW (ref 8.9–10.3)
Chloride: 103 mmol/L (ref 98–111)
Creatinine, Ser: 0.92 mg/dL (ref 0.61–1.24)
GFR, Estimated: >60 mL/min (ref >60)
Glucose, Bld: 84 mg/dL (ref 70–99)
Potassium: 4 mmol/L (ref 3.5–5.1)
Sodium: 139 mmol/L (ref 135–145)
Total Bilirubin: 0.5 mg/dL (ref 0.0–1.2)
Total Protein: 5.9 g/dL — ABNORMAL LOW (ref 6.5–8.1)

## 2024-06-17 LAB — CBC
HCT: 35.2 % — ABNORMAL LOW (ref 39.0–52.0)
Hemoglobin: 12.1 g/dL — ABNORMAL LOW (ref 13.0–17.0)
MCH: 29.1 pg (ref 26.0–34.0)
MCHC: 34.4 g/dL (ref 30.0–36.0)
MCV: 84.6 fL (ref 80.0–100.0)
Platelets: 230 10*3/uL (ref 150–400)
RBC: 4.16 MIL/uL — ABNORMAL LOW (ref 4.22–5.81)
RDW: 12.6 % (ref 11.5–15.5)
WBC: 6.3 10*3/uL (ref 4.0–10.5)
nRBC: 0 % (ref 0.0–0.2)

## 2024-06-17 LAB — LACTIC ACID, PLASMA
Lactic Acid, Venous: 2 mmol/L (ref 0.5–1.9)
Lactic Acid, Venous: 2 mmol/L (ref 0.5–1.9)

## 2024-06-17 LAB — HOMOCYSTEINE: Homocysteine: 18.3 umol/L — ABNORMAL HIGH (ref 0.0–14.5)

## 2024-06-17 MED ORDER — OXYCODONE HCL 5 MG PO TABS
10.0000 mg | ORAL_TABLET | Freq: Four times a day (QID) | ORAL | Status: DC | PRN
  Administered 2024-06-17 – 2024-06-19 (×6): 10 mg via ORAL
  Filled 2024-06-17 (×6): qty 2

## 2024-06-17 MED ORDER — LEVETIRACETAM 750 MG PO TABS
2000.0000 mg | ORAL_TABLET | Freq: Two times a day (BID) | ORAL | Status: DC
  Administered 2024-06-17 – 2024-06-19 (×4): 2000 mg via ORAL
  Filled 2024-06-17: qty 2
  Filled 2024-06-17 (×4): qty 1

## 2024-06-17 NOTE — TOC Initial Note (Addendum)
 Transition of Care [facility]) - Initial/Assessment Note    Patient Details  Name: Jason [name] MRN: [id number] Date of Birth: [birth year]  Transition of Care [facility]) CM/SW Contact:    Andrez JULIANNA George, RN Phone Number: 06/17/2024, 3:04 PM  Clinical Narrative:                  Pt is from home with his spouse and father. He states someone is with him most of the time. No DME.  Pt states he takes his keppra  as prescribed.  Pt drives or his spouse can provide needed transportation. Pt is without a PCP. He was in agreement with having CM assist in finding one. Appointment placed on AVS.  TOC following.   Expected Discharge Plan: Home/Self Care Barriers to Discharge: Continued Medical Work up   Patient Goals and CMS Choice            Expected Discharge Plan and Services   Discharge Planning Services: CM Consult   Living arrangements for the past 2 months: Apartment                                      Prior Living Arrangements/Services Living arrangements for the past 2 months: Apartment Lives with:: Spouse, Parents Patient language and need for interpreter reviewed:: Yes Do you feel safe going back to the place where you live?: Yes            Criminal Activity/Legal Involvement Pertinent to Current Situation/Hospitalization: No - Comment as needed  Activities of Daily Living      Permission Sought/Granted                  Emotional Assessment Appearance:: Appears stated age Attitude/Demeanor/Rapport: Engaged Affect (typically observed): Accepting Orientation: : Oriented to Self, Oriented to Place, Oriented to Situation   Psych Involvement: No (comment)  Admission diagnosis:  Seizure [facility]) [R56.9] Patient Active Problem List   Diagnosis Date Noted   Cocaine  abuse (HCC) 06/14/2024   Seizure (HCC) 01/26/2024   Status epilepticus (HCC) 01/25/2024   PCP:  Patient, No Pcp Per Pharmacy:   [facility] [phone] - [place], [place] - [address] [place] [address] Phone: [phone] Fax: [phone]  [id number] [place],  - [address] [place] [address] Phone: [phone] Fax: [phone]     Social Drivers of Health (SDOH) Social History: SDOH Screenings   Food Insecurity: Patient Unable To Answer (01/25/2024)  Housing: Patient Unable To Answer (01/25/2024)  Social Connections: Unknown (04/22/2022)   Received from [facility]  Tobacco Use: Unknown (06/14/2024)   SDOH Interventions:     Readmission Risk Interventions     No data to display

## 2024-06-17 NOTE — Progress Notes (Signed)
 NAME:  Jason [name], MRN:  [id number], DOB:  1976/08/26, LOS: 2 ADMISSION DATE:  06/14/2024, CONSULTATION DATE: 06/14/2024 REFERRING MD: Hospitalist, CHIEF COMPLAINT: Status epilepticus  History of Present Illness:  48 year old male with past medical history of seizures who presented to [facility] on 6/28 with seizures. Information obtained from wife in ED who reported seizure around 0230. He returned to baseline and was able to take his Keppra  dose around 0430. He then had second tonic-clonic seizure at 0730. Notes total of 2 seizures in the past two years prior to this. Labs in ED notable for WBC 12.3, glucose 128, ethanol negative, UA negative, UDS + cocaine , benzos. CT head negative. Given 1g Keppra  load. He failed to return to baseline after period of observation in ED and was admitted for observation. Neurology was also consulted. Later today (6/28) had 3rd tonic-clonic seizure on floor lasting about 2.5 minutes. Neurology was paged and recommended phenobarbital load. He was transferred to [facility] ICU with plan to transfer to [facility] for ongoing evaluation.    On my exam on admission, wife is at bedside. She states he has been depressed and in bed for the past week not eating much. States last night around 1830, he went to have BM and came back diffusely sweating which she states has not happened before.     Pertinent  Medical History  seizures  Significant Hospital Events: Including procedures, antibiotic start and stop dates in addition to other pertinent events   6/28: [facility] for x2 seizure at home>admitted>seized>phenobarb> ICU>request to transfer to [facility] for ongoing eval and management  Mentation slightly improved. Pending swallow eval.  Had persistent hypotension and hypoglycemia, lactic high. Started on fluids and abx broadened.  Interim History / Subjective:  More alert, oriented today.  Blood pressure is improved. He has been having low blood pressures when he lays on his side.  Objective     Blood pressure (!) 84/60, pulse 60, temperature 97.7 F (36.5 C), temperature source Axillary, resp. rate (!) 7, height 5' 8 (1.727 m), weight 71.3 kg, SpO2 97%.    FiO2 (%):  [28 %] 28 %   Intake/Output Summary (Last 24 hours) at 06/17/2024 0737 Last data filed at 06/17/2024 0600 Gross per 24 hour  Intake 3878.86 ml  Output 1225 ml  Net 2653.86 ml   Filed Weights   06/15/24 0500 06/16/24 0500 06/17/24 0500  Weight: 78.9 kg 70.3 kg 71.3 kg    Examination: General: Middle-age, does not appear to be in distress HENT: pupils equal  Lungs: Clear breath sounds Cardiovascular: S1-S2 appreciated Abdomen: Soft, bowel sounds appreciated Extremities: No clubbing, no edema Neuro: drowsy but more alert today, moving all extremities. Oriented to place and person.    I reviewed last 24 h vitals and pain scores, last 48 h intake and output, last 24 h labs and trends, and last 24 h imaging results.  Resolved problem list   Assessment and Plan   Status epilepticus History of seizures - Last seizure about 1800 hrs 6/28 - Had 3 seizures with no clear return to baseline - Received 1 g Keppra  load, continue keppra  2g q12 - Received phenobarb load after third seizure - Neuroprotective measures- normothermia, euglycemia, HOB greater than 30, head in neutral alignment, normocapnia, normoxia - Appreciate neurology following - As needed benzodiazepines for seizures - b12 281, obtain MMA, homocysteine.  - start IM repletion, followed by PO  Hypotension: multifactorial from poor po intake, hypotension, aspiration  -switched unasyn to zosyn -LR 2L  given -blood cultures pending.   Hypoglycemia: 2/2 poor po intake  -encourage po intake.   Polysubstance abuse with UDS showing benzos, cocaine  - Cessation counseling when appropriate - reduced dilaudid dosing, start po oxy  Best Practice (right click and Reselect all SmartList Selections daily)   Diet/type: regular diet. DVT prophylaxis  LMWH Pressure ulcer(s): N/A GI prophylaxis: N/A Lines: N/A Foley:  N/A Code Status:  full code Last date of multidisciplinary goals of care discussion [discussed with spouse]  Labs   CBC: Recent Labs  Lab 06/14/24 0820 06/15/24 0707 06/16/24 0959  WBC 12.3* 11.5* 7.8  NEUTROABS 10.7*  --   --   HGB 15.2 14.0 13.2  HCT 42.3 39.6 38.4*  MCV 82.5 81.8 85.0  PLT 298 286 242    Basic Metabolic Panel: Recent Labs  Lab 06/14/24 0820 06/14/24 2021 06/15/24 0707 06/16/24 0959  NA 138 137 136 138  K 3.7 4.0 3.3* 4.0  CL 103 104 103 105  CO2 22 18* 24 22  GLUCOSE 128* 144* 108* 78  BUN 17 14 15  21*  CREATININE 0.93 1.04 0.92 1.00  CALCIUM 9.8 10.0 8.9 9.1  MG  --  2.5*  --  2.3  PHOS  --   --   --  3.4   GFR: Estimated Creatinine Clearance: 88.4 mL/min (by C-G formula based on SCr of 1 mg/dL). Recent Labs  Lab 06/14/24 0820 06/15/24 0707 06/16/24 0959 06/16/24 1654 06/16/24 1917  WBC 12.3* 11.5* 7.8  --   --   LATICACIDVEN  --   --   --  3.9* 2.3*    Liver Function Tests: Recent Labs  Lab 06/14/24 0820 06/15/24 0707  AST 35 19  ALT 26 17  ALKPHOS 77 58  BILITOT 0.9 1.6*  PROT 8.4* 7.4  ALBUMIN 4.2 3.8   No results for input(s): LIPASE, AMYLASE in the last 168 hours. No results for input(s): AMMONIA in the last 168 hours.  ABG No results found for: PHART, PCO2ART, PO2ART, HCO3, TCO2, ACIDBASEDEF, O2SAT   Coagulation Profile: No results for input(s): INR, PROTIME in the last 168 hours.  Cardiac Enzymes: Recent Labs  Lab 06/16/24 1913  CKTOTAL 28*    HbA1C: Hgb A1c MFr Bld  Date/Time Value Ref Range Status  06/16/2024 04:54 PM 5.0 4.8 - 5.6 % Final    Comment:    (NOTE) Diagnosis of Diabetes The following HbA1c ranges recommended by the American Diabetes Association (ADA) may be used as an aid in the diagnosis of diabetes mellitus.  Hemoglobin             Suggested A1C NGSP%              Diagnosis  <5.7                    Non Diabetic  5.7-6.4                Pre-Diabetic  >6.4                   Diabetic  <7.0                   Glycemic control for                       adults with diabetes.      CBG: Recent Labs  Lab 06/16/24 1607 06/16/24 1928 06/16/24 2329 06/17/24 0321 06/17/24 0734  GLUCAP 78 112* 103*  116* 111*    Review of Systems:   No significant ongoing pain or discomfort  Past Medical History:  He,  has a past medical history of Seizures (HCC).   Surgical History:   Past Surgical History:  Procedure Laterality Date   TONSILLECTOMY      Social History:   reports that he has never smoked. He does not have any smokeless tobacco history on file. He reports that he does not drink alcohol and does not use drugs.   Family History:  His family history is not on file.   Allergies Allergies  Allergen Reactions   Hydrocodone-Acetaminophen  Nausea And Vomiting    The patient is critically ill with multiple organ systems failure and requires high complexity decision making for assessment and support, frequent evaluation and titration of therapies, application of advanced monitoring technologies and extensive interpretation of multiple databases. Critical Care Time devoted to patient care services described in this note independent of APP/resident time (if applicable)  is 35 minutes.   Kevin Legions MD [facility] CCM On-call: [phone]

## 2024-06-17 NOTE — TOC CAGE-AID Note (Signed)
 Transition of Care [facility]) - CAGE-AID Screening   Patient Details  Name: Jason [name] MRN: [id number] Date of Birth: [birth year]  Transition of Care [facility]) CM/SW Contact:    Andrez JULIANNA George, RN Phone Number: 06/17/2024, 2:50 PM   Clinical Narrative:  Pt refused inpatient/ outpatient drug counseling resources.  CAGE-AID Screening:               Substance Abuse Education Offered: Yes (refused)

## 2024-06-17 NOTE — Progress Notes (Addendum)
 [facility] PROGRESS NOTE   Jason [name] FMW:[id number] DOB: 1976-08-16 DOA: 06/14/2024  PCP: Patient, No Pcp Per  Brief History: 48 year old male with past medical history of seizures who presented to [facility] on 6/28 with seizures. Information obtained from wife in ED who reported seizure around 0230. He returned to baseline and was able to take his Keppra  dose around 0430. He then had second tonic-clonic seizure at 0730.  Patient continued to have multiple seizure activities and so he was transferred to [facility] ICU.  He was stabilized and then transferred to the floor.     Consultants: Neurology.  Critical care medicine  Procedures: EEG    Subjective/Interval History: Patient was oriented to place year or month.  Complains of pain all over.  No other complaints offered.    Assessment/Plan:  Status epilepticus in the setting of a history of seizures Had multiple seizures while being hospitalized.  Was monitored in the ICU.  Seen by neurology. It is felt that his cocaine  abuse may have contributed to breakthrough seizures.  There was also some concern for missing a dose of Keppra  at home. Continue Keppra . Can be changed over to oral since he has been cleared for oral intake. Neurological status is stable.  B12 deficiency Low B12 noted on labs.  Started on B12 supplementation.  Hypotension Likely multifactorial.  Improved this morning.  He was given IV fluids in the ICU. Noted to be afebrile.  WBC is normal.  Did have mild lactic acidosis which has improved.  Continue to monitor.  Concern for aspiration pneumonia Noted to be on Zosyn.  Was on Unasyn previously.  Respiratory status is stable. Chest x-ray from 6/29 did not suggest any pneumonia. Consider discontinuation of antibiotics in the next 24 to 48 hours.  Hypoglycemia Due to poor oral intake.  Improved after he was given something to eat.  Polysubstance abuse Urine drug screen positive for benzos  and cocaine .  He was counseled.   DVT Prophylaxis: Lovenox  Code Status: Full code Family Communication: Discussed with patient.  No family at bedside Disposition Plan: Mobilize.     Medications: Scheduled:  Chlorhexidine  Gluconate Cloth  6 each Topical Q0600   vitamin B-12  1,000 mcg Oral Daily   enoxaparin  (LOVENOX ) injection  40 mg Subcutaneous Q24H   levETIRAcetam   2,000 mg Intravenous Q12H   Continuous:  piperacillin-tazobactam (ZOSYN)  IV 12.5 mL/hr at 06/17/24 1049   PRN:acetaminophen , docusate sodium , HYDROmorphone (DILAUDID) injection, ketorolac , ondansetron  (ZOFRAN ) IV, mouth rinse, oxyCODONE , polyethylene glycol  Antibiotics: Anti-infectives (From admission, onward)    Start     Dose/Rate Route Frequency Ordered Stop   06/16/24 1715  piperacillin-tazobactam (ZOSYN) IVPB 3.375 g        3.375 g 12.5 mL/hr over 240 Minutes Intravenous Every 8 hours 06/16/24 1629     06/14/24 1900  Ampicillin-Sulbactam (UNASYN) 3 g in sodium chloride  0.9 % 100 mL IVPB  Status:  Discontinued        3 g 200 mL/hr over 30 Minutes Intravenous Every 6 hours 06/14/24 1805 06/16/24 1626       Objective:  Vital Signs  Vitals:   06/17/24 0800 06/17/24 0900 06/17/24 1000 06/17/24 1033  BP: 114/83 103/77 104/71 122/84  Pulse: 78 75 73 76  Resp: 15 11 (!) 7 16  Temp: 97.8 F (36.6 C)   97.9 F (36.6 C)  TempSrc: Oral   Oral  SpO2: 99% 100% 97% 98%  Weight:      Height:  Intake/Output Summary (Last 24 hours) at 06/17/2024 1114 Last data filed at 06/17/2024 1001 Gross per 24 hour  Intake 3359.53 ml  Output 1125 ml  Net 2234.53 ml   Filed Weights   06/15/24 0500 06/16/24 0500 06/17/24 0500  Weight: 78.9 kg 70.3 kg 71.3 kg    General appearance: Awake alert.  In no distress. Resp: Clear to auscultation bilaterally.  Normal effort Cardio: S1-S2 is normal regular.  No S3-S4.  No rubs murmurs or bruit GI: Abdomen is soft.  Nontender nondistended.  Bowel sounds are present  normal.  No masses organomegaly Extremities: No edema.  Full range of motion of lower extremities. Neurologic: Alert and oriented x3.  No focal neurological deficits.    Lab Results:  Data Reviewed: I have personally reviewed following labs and reports of the imaging studies  CBC: Recent Labs  Lab 06/14/24 0820 06/15/24 0707 06/16/24 0959 06/17/24 0921  WBC 12.3* 11.5* 7.8 6.3  NEUTROABS 10.7*  --   --   --   HGB 15.2 14.0 13.2 12.1*  HCT 42.3 39.6 38.4* 35.2*  MCV 82.5 81.8 85.0 84.6  PLT 298 286 242 230    Basic Metabolic Panel: Recent Labs  Lab 06/14/24 0820 06/14/24 2021 06/15/24 0707 06/16/24 0959 06/17/24 0921  NA 138 137 136 138 139  K 3.7 4.0 3.3* 4.0 4.0  CL 103 104 103 105 103  CO2 22 18* 24 22 28   GLUCOSE 128* 144* 108* 78 84  BUN 17 14 15  21* 13  CREATININE 0.93 1.04 0.92 1.00 0.92  CALCIUM 9.8 10.0 8.9 9.1 8.4*  MG  --  2.5*  --  2.3  --   PHOS  --   --   --  3.4  --     GFR: Estimated Creatinine Clearance: 96 mL/min (by C-G formula based on SCr of 0.92 mg/dL).  Liver Function Tests: Recent Labs  Lab 06/14/24 0820 06/15/24 0707 06/17/24 0921  AST 35 19 24  ALT 26 17 18   ALKPHOS 77 58 48  BILITOT 0.9 1.6* 0.5  PROT 8.4* 7.4 5.9*  ALBUMIN 4.2 3.8 2.9*    Cardiac Enzymes: Recent Labs  Lab 06/16/24 1913  CKTOTAL 28*    HbA1C: Recent Labs    06/16/24 1654  HGBA1C 5.0    CBG: Recent Labs  Lab 06/16/24 1607 06/16/24 1928 06/16/24 2329 06/17/24 0321 06/17/24 0734  GLUCAP 78 112* 103* 116* 111*     Thyroid Function Tests: Recent Labs    06/14/24 2021  TSH 0.592  FREET4 0.64    Anemia Panel: Recent Labs    06/14/24 2021  VITAMINB12 281  FOLATE 12.7    Recent Results (from the past 240 hours)  MRSA Next Gen by PCR, Nasal     Status: None   Collection Time: 06/14/24  6:21 PM   Specimen: Nasal Mucosa; Nasal Swab  Result Value Ref Range Status   MRSA by PCR Next Gen NOT DETECTED NOT DETECTED Final    Comment:  (NOTE) The GeneXpert MRSA Assay (FDA approved for NASAL specimens only), is one component of a comprehensive MRSA colonization surveillance program. It is not intended to diagnose MRSA infection nor to guide or monitor treatment for MRSA infections. Test performance is not FDA approved in patients less than 28 years old. Performed at [facility], [address], [place], [place] [address]   MRSA Next Gen by PCR, Nasal     Status: None   Collection Time: 06/14/24 11:55 PM  Specimen: Nasal Mucosa; Nasal Swab  Result Value Ref Range Status   MRSA by PCR Next Gen NOT DETECTED NOT DETECTED Final    Comment: (NOTE) The GeneXpert MRSA Assay (FDA approved for NASAL specimens only), is one component of a comprehensive MRSA colonization surveillance program. It is not intended to diagnose MRSA infection nor to guide or monitor treatment for MRSA infections. Test performance is not FDA approved in patients less than 32 years old. Performed at [facility], [address], [place], [place] [address]   Culture, blood (Routine X 2) w Reflex to ID Panel     Status: None (Preliminary result)   Collection Time: 06/16/24  4:54 PM   Specimen: BLOOD  Result Value Ref Range Status   Specimen Description BLOOD SITE NOT SPECIFIED  Final   Special Requests   Final    BOTTLES DRAWN AEROBIC ONLY Blood Culture adequate volume   Culture   Final    NO GROWTH < 12 HOURS Performed at [facility], [address], [place], [place] [address]    Report Status PENDING  Incomplete  Culture, blood (Routine X 2) w Reflex to ID Panel     Status: None (Preliminary result)   Collection Time: 06/16/24  5:29 PM   Specimen: BLOOD  Result Value Ref Range Status   Specimen Description BLOOD SITE NOT SPECIFIED  Final   Special Requests   Final    BOTTLES DRAWN AEROBIC ONLY Blood Culture results may not be optimal due to an inadequate volume of blood received in culture bottles   Culture   Final    NO GROWTH  < 12 HOURS Performed at [facility], [address], [place], [place] [address]    Report Status PENDING  Incomplete      Radiology Studies: No results found.     LOS: 2 days   Malissia Rabbani Verdene  [facility] Pager on [email or web address]  06/17/2024, 11:14 AM

## 2024-06-17 NOTE — Progress Notes (Signed)
 Speech Language Pathology Treatment: Dysphagia  Patient Details Name: Jason [name] MRN: [id number] DOB: [birth year] Today's Date: 06/17/2024 Time: 8771-8763 SLP Time Calculation (min) (ACUTE ONLY): 8 min  Assessment / Plan / Recommendation Clinical Impression  Pt's mentation appears significantly improved compared to previous date. Observed pt sitting upright, feeding himself Dys 3 solids from his meal tray without overt s/s of dysphagia. While he is edentulous, he demonstrates the ability to cut food into manageable pieces and use a liquid wash as needed. No coughing was observed with thin liquids today, even when challenged with sequential sips. Recommend upgrading to regular solids and continuing thin liquids. No further SLP f/u is needed, will sign off.    HPI HPI: Jason [name] is a 48 yo male presenting to [facility] ED 6/28 after having two seizures. UDS + cocaine , benzos. CTH negative. Failed to return to baseline in ED and later had a third tonic-clonic seizure lasting ~2.5 minutes; transferred to [facility] and neurology recommended phenobarbital load. PMH includes seizures      SLP Plan  All goals met          Recommendations  Diet recommendations: Regular;Thin liquid Liquids provided via: Cup;Straw Medication Administration: Whole meds with liquid Supervision: Patient able to self feed Compensations: Minimize environmental distractions;Slow rate;Small sips/bites;Follow solids with liquid Postural Changes and/or Swallow Maneuvers: Seated upright 90 degrees                  Oral care BID   PRN Dysphagia, unspecified (R13.10)     All goals met     Damien Blumenthal, M.A., CCC-SLP Speech Language Pathology, Acute Rehabilitation Services  Secure Chat preferred [phone]   06/17/2024, 12:57 PM

## 2024-06-18 DIAGNOSIS — R569 Unspecified convulsions: Secondary | ICD-10-CM | POA: Diagnosis not present

## 2024-06-18 LAB — BASIC METABOLIC PANEL WITH GFR
Anion gap: 10 (ref 5–15)
BUN: 10 mg/dL (ref 6–20)
CO2: 25 mmol/L (ref 22–32)
Calcium: 8.9 mg/dL (ref 8.9–10.3)
Chloride: 101 mmol/L (ref 98–111)
Creatinine, Ser: 0.89 mg/dL (ref 0.61–1.24)
GFR, Estimated: >60 mL/min (ref >60)
Glucose, Bld: 103 mg/dL — ABNORMAL HIGH (ref 70–99)
Potassium: 3.7 mmol/L (ref 3.5–5.1)
Sodium: 136 mmol/L (ref 135–145)

## 2024-06-18 LAB — GLUCOSE, CAPILLARY
Glucose-Capillary: 112 mg/dL — ABNORMAL HIGH (ref 70–99)
Glucose-Capillary: 113 mg/dL — ABNORMAL HIGH (ref 70–99)
Glucose-Capillary: 122 mg/dL — ABNORMAL HIGH (ref 70–99)
Glucose-Capillary: 125 mg/dL — ABNORMAL HIGH (ref 70–99)
Glucose-Capillary: 141 mg/dL — ABNORMAL HIGH (ref 70–99)
Glucose-Capillary: 92 mg/dL (ref 70–99)

## 2024-06-18 LAB — CBC
HCT: 36.1 % — ABNORMAL LOW (ref 39.0–52.0)
Hemoglobin: 12.8 g/dL — ABNORMAL LOW (ref 13.0–17.0)
MCH: 28.6 pg (ref 26.0–34.0)
MCHC: 35.5 g/dL (ref 30.0–36.0)
MCV: 80.8 fL (ref 80.0–100.0)
Platelets: 244 10*3/uL (ref 150–400)
RBC: 4.47 MIL/uL (ref 4.22–5.81)
RDW: 12.6 % (ref 11.5–15.5)
WBC: 6.5 10*3/uL (ref 4.0–10.5)
nRBC: 0 % (ref 0.0–0.2)

## 2024-06-18 LAB — PROCALCITONIN: Procalcitonin: <0.1 ng/mL

## 2024-06-18 MED ORDER — POTASSIUM CHLORIDE CRYS ER 20 MEQ PO TBCR
40.0000 meq | EXTENDED_RELEASE_TABLET | Freq: Once | ORAL | Status: AC
  Administered 2024-06-18: 40 meq via ORAL
  Filled 2024-06-18: qty 2

## 2024-06-18 NOTE — Progress Notes (Signed)
 Inpatient Rehab Admissions Coordinator:   Per therapy recommendations pt was screened for CIR by Reche Lowers, PT, DPT.  Admitted for seizures, all medical problems stable and not requiring active management at this time.  He does not qualify for CIR.  Recommend f/u at alternative LOC.    Reche Lowers, PT, DPT Admissions Coordinator [phone] 06/18/24  12:36 PM

## 2024-06-18 NOTE — Plan of Care (Signed)
 Complaints of back pain, given with prn meds. No other complaints noted. Problem: Health Behavior/Discharge Planning: Goal: Ability to manage health-related needs will improve Outcome: Progressing   Problem: Clinical Measurements: Goal: Will remain free from infection Outcome: Progressing   Problem: Activity: Goal: Risk for activity intolerance will decrease Outcome: Progressing   Problem: Nutrition: Goal: Adequate nutrition will be maintained Outcome: Progressing   Problem: Coping: Goal: Level of anxiety will decrease Outcome: Progressing   Problem: Pain Managment: Goal: General experience of comfort will improve and/or be controlled Outcome: Progressing   Problem: Safety: Goal: Ability to remain free from injury will improve Outcome: Progressing   Problem: Skin Integrity: Goal: Risk for impaired skin integrity will decrease Outcome: Progressing

## 2024-06-18 NOTE — Evaluation (Addendum)
 Occupational Therapy Evaluation Patient Details Name: Jason [name] MRN: [id number] DOB: 01-12-1976 Today's Date: 06/18/2024   History of Present Illness   48 y.o. male presents to [facility]14/24 from [facility] after having two tonic-clonic type seizures at home. Head CT negative, UDS + for cocaine  and benzodiazepines. Had 3rd seizure in hospital with transfer to [facility]. 6/29 EEG showed moderate/severe diffuse encephalopathy, generalized epilepsy. PMH: epilepsy on keppra , polysubstance abuse, remote HSV encephalitis     Clinical Impressions PTA patient reports independent with ADLs, light iADls and mobility; not currently working but reports he can drive.  Admitted for above and presents with problem list below.  He requires min assist for bed mobility, min assist +2 safety for mobility using RW and up to min assist for ADLs.  Pt reports dizziness with mobility OOB, but BP stable and reports the room is spinning; decreases with increased time after positional changes.  He reports decreased recall, during session presents with poor problem solving, decreased attention, and slow processing. Based on performance today, believe pt will best benefit from continued OT services acutely and after dc at an inpatient setting with >3hrs/day to optimize independence, safety and return to PLOF with ADLs and mobility.     If plan is discharge home, recommend the following:   A little help with walking and/or transfers;A little help with bathing/dressing/bathroom;Assistance with cooking/housework;Direct supervision/assist for medications management;Direct supervision/assist for financial management;Assist for transportation;Help with stairs or ramp for entrance;Supervision due to cognitive status     Functional Status Assessment   Patient has had a recent decline in their functional status and demonstrates the ability to make significant improvements in function in a reasonable and predictable amount of time.      Equipment Recommendations   BSC/3in1;Other (comment) (RW)     Recommendations for Other Services   Rehab consult     Precautions/Restrictions   Precautions Precautions: Fall Recall of Precautions/Restrictions: Impaired Precaution/Restrictions Comments: hx of seizures Restrictions Weight Bearing Restrictions Per Provider Order: No     Mobility Bed Mobility Overal bed mobility: Needs Assistance Bed Mobility: Supine to Sit, Sit to Supine     Supine to sit: Min assist Sit to supine: Min assist   General bed mobility comments: MinA for trunk support with supine/sit. MinA for LE management with return to supine    Transfers Overall transfer level: Needs assistance Equipment used: Rolling walker (2 wheels) Transfers: Sit to/from Stand Sit to Stand: Min assist, +2 safety/equipment           General transfer comment: MinA for boost-up and steadying assist. Pt leaning in all directions with cues to focus on target and for hand placement with RW. x2 for safety      Balance Overall balance assessment: Needs assistance, Mild deficits observed, not formally tested Sitting-balance support: No upper extremity supported, Feet supported Sitting balance-Leahy Scale: Fair     Standing balance support: Bilateral upper extremity supported, During functional activity, Reliant on assistive device for balance Standing balance-Leahy Scale: Poor Standing balance comment: reliant on external and UE support, tends to sway in all directions                           ADL either performed or assessed with clinical judgement   ADL Overall ADL's : Needs assistance/impaired     Grooming: Sitting;Minimal assistance;Wash/dry hands           Upper Body Dressing : Contact guard assist;Sitting   Lower  Body Dressing: Minimal assistance;Sit to/from stand Lower Body Dressing Details (indicate cue type and reason): able to manage socks in figure 4 technique but needing  contact guard for balance in sitting and min assist when standing Toilet Transfer: Minimal assistance;Ambulation;+2 for safety/equipment;Rolling walker (2 wheels)           Functional mobility during ADLs: Minimal assistance;+2 for safety/equipment;Rolling walker (2 wheels);Cueing for safety       Vision Baseline Vision/History: 1 Wears glasses Patient Visual Report: No change from baseline (glasses not present, typically wears all the time) Vision Assessment?: No apparent visual deficits Additional Comments: tracking [facility]     Perception         Praxis         Pertinent Vitals/Pain Pain Assessment Pain Assessment: Faces Faces Pain Scale: Hurts even more Pain Location: all over Pain Descriptors / Indicators: Aching, Discomfort Pain Intervention(s): Limited activity within patient's tolerance, Monitored during session, Repositioned     Extremity/Trunk Assessment Upper Extremity Assessment Upper Extremity Assessment: Generalized weakness   Lower Extremity Assessment Lower Extremity Assessment: Defer to PT evaluation   Cervical / Trunk Assessment Cervical / Trunk Assessment: Normal   Communication Communication Communication: Impaired Factors Affecting Communication: Reduced clarity of speech (decreased volume)   Cognition Arousal: Alert Behavior During Therapy: Flat affect Cognition: Cognition impaired     Awareness: Online awareness impaired Memory impairment (select all impairments): Working [employer] fast streamer, Short-term memory Attention impairment (select first level of impairment): Sustained attention Executive functioning impairment (select all impairments): Sequencing, Problem solving OT - Cognition Comments: pt oriented and following simple commands.  slow processing and decreased problem solving noted. he reports difficulty with recall                 Following commands: Impaired Following commands impaired: Follows one step commands inconsistently,  Follows one step commands with increased time     Cueing  General Comments   Cueing Techniques: Verbal cues;Tactile cues  spouse at side; pt dizzy with movement OOB but BP stable   Exercises     Shoulder Instructions      Home Living Family/patient expects to be discharged to:: Private residence Living Arrangements: Spouse/significant other;Parent (dad) Available Help at Discharge: Family;Available 24 hours/day Type of Home: Apartment Home Access: Stairs to enter Entrance Stairs-Number of Steps: 18 (interior steps) Entrance Stairs-Rails: Right Home Layout: One level     Bathroom Shower/Tub: Chief Strategy Officer: Standard     Home Equipment: None          Prior Functioning/Environment Prior Level of Function : Independent/Modified Independent;Working/employed;Driving               ADLs Comments: not currently working, light IADLs, spouse assists with meds. watches TV and enjoys video games    OT Problem List: Decreased activity tolerance;Impaired balance (sitting and/or standing);Pain;Decreased strength;Decreased cognition;Decreased knowledge of use of DME or AE;Decreased knowledge of precautions;Decreased safety awareness   OT Treatment/Interventions: Self-care/ADL training;Therapeutic exercise;DME and/or AE instruction;Therapeutic activities;Cognitive remediation/compensation;Patient/family education;Balance training;Energy conservation      OT Goals(Current goals can be found in the care plan section)   Acute Rehab OT Goals Patient Stated Goal: get better OT Goal Formulation: With patient Time For Goal Achievement: 07/02/24 Potential to Achieve Goals: Good   OT Frequency:  Min 2X/week    Co-evaluation PT/OT/SLP Co-Evaluation/Treatment: Yes Reason for Co-Treatment: For patient/therapist safety;To address functional/ADL transfers;Complexity of the patient's impairments (multi-system involvement) PT goals addressed during session:  Mobility/safety with mobility;Balance;Proper use of DME  OT goals addressed during session: ADL's and self-care      AM-PAC OT 6 Clicks Daily Activity     Outcome Measure Help from another person eating meals?: A Little Help from another person taking care of personal grooming?: A Little Help from another person toileting, which includes using toliet, bedpan, or urinal?: A Little Help from another person bathing (including washing, rinsing, drying)?: A Little Help from another person to put on and taking off regular upper body clothing?: A Little Help from another person to put on and taking off regular lower body clothing?: A Little 6 Click Score: 18   End of Session Equipment Utilized During Treatment: Gait belt;Rolling walker (2 wheels) Nurse Communication: Mobility status;Precautions  Activity Tolerance: Patient tolerated treatment well Patient left: in bed;with call bell/phone within reach;with bed alarm set;with nursing/sitter in room;with SCD's reapplied  OT Visit Diagnosis: Other abnormalities of gait and mobility (R26.89);Muscle weakness (generalized) (M62.81);Other symptoms and signs involving cognitive function;Pain Pain - part of body:  (all over)                Time: 8991-8966 OT Time Calculation (min): 25 min Charges:  OT General Charges $OT Visit: 1 Visit OT Evaluation $OT Eval Moderate Complexity: 1 Mod  Etta NOVAK, OT Acute Rehabilitation Services Office [phone] Secure Chat Preferred    Etta GORMAN [place] 06/18/2024, 11:56 AM

## 2024-06-18 NOTE — Progress Notes (Signed)
 [facility] PROGRESS NOTE   Jason [name] FMW:[id number] DOB: [birth year] DOA: 06/14/2024  PCP: Patient, No Pcp Per  Brief History: 48 year old male with past medical history of seizures who presented to [facility] on 6/28 with seizures. Information obtained from wife in ED who reported seizure around 0230. He returned to baseline and was able to take his Keppra  dose around 0430. He then had second tonic-clonic seizure at 0730.  Patient continued to have multiple seizure activities and so he was transferred to [facility] ICU.  He was stabilized and then transferred to the floor.     Consultants: Neurology.  Critical care medicine  Procedures: EEG    Subjective/Interval History: Patient mentioned that he is feeling better.  Denies any complaints this morning.  Wife is at the bedside.    Assessment/Plan:  Status epilepticus in the setting of a history of seizures Had multiple seizures while being hospitalized.  Was monitored in the ICU.  Seen by neurology. It is felt that his cocaine  abuse may have contributed to breakthrough seizures.  There was also some concern for missing a dose of Keppra  at home. Patient was seen by neurology.  Given Keppra  intravenously.  Changed over to oral yesterday. Neurological status is stable.  Ambulating today. Started on diet.  B12 deficiency Low B12 noted on labs.  Started on B12 supplementation.  Hypotension Likely multifactorial.  He was given IV fluids in the ICU. Blood pressure has improved. Lactic acidosis has improved.  Concern for aspiration pneumonia Noted to be on Zosyn.  Was on Unasyn previously.  Respiratory status is stable. Chest x-ray from 6/29 did not suggest any pneumonia. Procalcitonin level is less than 0.1.  WBC is 6.5.  Blood cultures without any growth.  Will discontinue Zosyn.  Hypoglycemia Due to poor oral intake.  Improved after he was given something to eat.  Polysubstance abuse Urine drug screen  positive for benzos and cocaine .  He was counseled.  Normocytic anemia Stable.   DVT Prophylaxis: Lovenox  Code Status: Full code Family Communication: Discussed with patient.  No family at bedside Disposition Plan: Mobilize.  Anticipate discharge tomorrow.     Medications: Scheduled:  Chlorhexidine  Gluconate Cloth  6 each Topical Q0600   vitamin B-12  1,000 mcg Oral Daily   enoxaparin  (LOVENOX ) injection  40 mg Subcutaneous Q24H   levETIRAcetam   2,000 mg Oral BID   Continuous:  piperacillin-tazobactam (ZOSYN)  IV 3.375 g (06/18/24 0607)   PRN:acetaminophen , docusate sodium , HYDROmorphone (DILAUDID) injection, ketorolac , ondansetron  (ZOFRAN ) IV, mouth rinse, oxyCODONE , polyethylene glycol  Antibiotics: Anti-infectives (From admission, onward)    Start     Dose/Rate Route Frequency Ordered Stop   06/16/24 1715  piperacillin-tazobactam (ZOSYN) IVPB 3.375 g        3.375 g 12.5 mL/hr over 240 Minutes Intravenous Every 8 hours 06/16/24 1629     06/14/24 1900  Ampicillin-Sulbactam (UNASYN) 3 g in sodium chloride  0.9 % 100 mL IVPB  Status:  Discontinued        3 g 200 mL/hr over 30 Minutes Intravenous Every 6 hours 06/14/24 1805 06/16/24 1626       Objective:  Vital Signs  Vitals:   06/17/24 2028 06/17/24 2357 06/18/24 0415 06/18/24 0745  BP: (!) 133/92 134/82 104/86 126/81  Pulse: 92 90 81 77  Resp: 16 14 14 19   Temp: 98.6 F (37 C) 98.2 F (36.8 C) 98.7 F (37.1 C) 98.3 F (36.8 C)  TempSrc: Axillary Oral Oral Oral  SpO2: 96% 97%  96% 100%  Weight:      Height:        Intake/Output Summary (Last 24 hours) at 06/18/2024 0938 Last data filed at 06/18/2024 0454 Gross per 24 hour  Intake 180 ml  Output 4275 ml  Net -4095 ml   Filed Weights   06/15/24 0500 06/16/24 0500 06/17/24 0500  Weight: 78.9 kg 70.3 kg 71.3 kg    General appearance: Awake alert.  In no distress Resp: Clear to auscultation bilaterally.  Normal effort Cardio: S1-S2 is normal regular.  No  S3-S4.  No rubs murmurs or bruit GI: Abdomen is soft.  Nontender nondistended.  Bowel sounds are present normal.  No masses organomegaly No obvious focal neurological deficits noted.  Lab Results:  Data Reviewed: I have personally reviewed following labs and reports of the imaging studies  CBC: Recent Labs  Lab 06/14/24 0820 06/15/24 0707 06/16/24 0959 06/17/24 0921 06/18/24 0453  WBC 12.3* 11.5* 7.8 6.3 6.5  NEUTROABS 10.7*  --   --   --   --   HGB 15.2 14.0 13.2 12.1* 12.8*  HCT 42.3 39.6 38.4* 35.2* 36.1*  MCV 82.5 81.8 85.0 84.6 80.8  PLT 298 286 242 230 244    Basic Metabolic Panel: Recent Labs  Lab 06/14/24 2021 06/15/24 0707 06/16/24 0959 06/17/24 0921 06/18/24 0453  NA 137 136 138 139 136  K 4.0 3.3* 4.0 4.0 3.7  CL 104 103 105 103 101  CO2 18* 24 22 28 25   GLUCOSE 144* 108* 78 84 103*  BUN 14 15 21* 13 10  CREATININE 1.04 0.92 1.00 0.92 0.89  CALCIUM 10.0 8.9 9.1 8.4* 8.9  MG 2.5*  --  2.3  --   --   PHOS  --   --  3.4  --   --     GFR: Estimated Creatinine Clearance: 99.3 mL/min (by C-G formula based on SCr of 0.89 mg/dL).  Liver Function Tests: Recent Labs  Lab 06/14/24 0820 06/15/24 0707 06/17/24 0921  AST 35 19 24  ALT 26 17 18   ALKPHOS 77 58 48  BILITOT 0.9 1.6* 0.5  PROT 8.4* 7.4 5.9*  ALBUMIN 4.2 3.8 2.9*    Cardiac Enzymes: Recent Labs  Lab 06/16/24 1913  CKTOTAL 28*    HbA1C: Recent Labs    06/16/24 1654  HGBA1C 5.0    CBG: Recent Labs  Lab 06/17/24 1635 06/17/24 2026 06/18/24 0000 06/18/24 0417 06/18/24 0744  GLUCAP 165* 139* 122* 112* 92     Recent Results (from the past 240 hours)  MRSA Next Gen by PCR, Nasal     Status: None   Collection Time: 06/14/24  6:21 PM   Specimen: Nasal Mucosa; Nasal Swab  Result Value Ref Range Status   MRSA by PCR Next Gen NOT DETECTED NOT DETECTED Final    Comment: (NOTE) The GeneXpert MRSA Assay (FDA approved for NASAL specimens only), is one component of a comprehensive  MRSA colonization surveillance program. It is not intended to diagnose MRSA infection nor to guide or monitor treatment for MRSA infections. Test performance is not FDA approved in patients less than 62 years old. Performed at [facility], [address] [place], [place] [address]   MRSA Next Gen by PCR, Nasal     Status: None   Collection Time: 06/14/24 11:55 PM   Specimen: Nasal Mucosa; Nasal Swab  Result Value Ref Range Status   MRSA by PCR Next Gen NOT DETECTED NOT DETECTED Final  Comment: (NOTE) The GeneXpert MRSA Assay (FDA approved for NASAL specimens only), is one component of a comprehensive MRSA colonization surveillance program. It is not intended to diagnose MRSA infection nor to guide or monitor treatment for MRSA infections. Test performance is not FDA approved in patients less than 56 years old. Performed at [facility], [address], [place], [place] [address]   Culture, blood (Routine X 2) w Reflex to ID Panel     Status: None (Preliminary result)   Collection Time: 06/16/24  4:54 PM   Specimen: BLOOD  Result Value Ref Range Status   Specimen Description BLOOD SITE NOT SPECIFIED  Final   Special Requests   Final    BOTTLES DRAWN AEROBIC ONLY Blood Culture adequate volume   Culture   Final    NO GROWTH 2 DAYS Performed at [facility], [address], [place], [place] [address]    Report Status PENDING  Incomplete  Culture, blood (Routine X 2) w Reflex to ID Panel     Status: None (Preliminary result)   Collection Time: 06/16/24  5:29 PM   Specimen: BLOOD  Result Value Ref Range Status   Specimen Description BLOOD SITE NOT SPECIFIED  Final   Special Requests   Final    BOTTLES DRAWN AEROBIC ONLY Blood Culture results may not be optimal due to an inadequate volume of blood received in culture bottles   Culture   Final    NO GROWTH 2 DAYS Performed at [facility], [address], [place], [place] [address]    Report Status PENDING   Incomplete      Radiology Studies: No results found.     LOS: 3 days   Srah Ake [employer] on [email or web address]  06/18/2024, 9:38 AM

## 2024-06-18 NOTE — Plan of Care (Signed)
 Much of the day room very dark and he has been sleeping.  Did allow me to open blinds and was reminded that we need to try to get the room bright and get him up out of the bed and out of his depressive state, as wife noted that he has been depressed and stays in the bed at home in the dark and has been doing that for a while and does not ever get up or eat.  He does have an appetite now and is sitting on the side of the bed talking with wife with blinds open.  No seizures or signs of distress.     Problem: Education: Goal: Knowledge of General Education information will improve Description: Including pain rating scale, medication(s)/side effects and non-pharmacologic comfort measures Outcome: Progressing   Problem: Health Behavior/Discharge Planning: Goal: Ability to manage health-related needs will improve Outcome: Progressing   Problem: Clinical Measurements: Goal: Will remain free from infection Outcome: Progressing   Problem: Clinical Measurements: Goal: Respiratory complications will improve Outcome: Progressing   Problem: Activity: Goal: Risk for activity intolerance will decrease Outcome: Progressing   Problem: Nutrition: Goal: Adequate nutrition will be maintained Outcome: Progressing   Problem: Coping: Goal: Level of anxiety will decrease Outcome: Progressing

## 2024-06-18 NOTE — Evaluation (Signed)
 Physical Therapy Evaluation Patient Details Name: Jason [name] MRN: [id number] DOB: Jun 03, 1976 Today's Date: 06/18/2024  History of Present Illness  48 y.o. male presents to [facility] after having two tonic-clonic type seizures at home. Head CT negative, UDS + for cocaine  and benzodiazepines. Had 3rd seizure in hospital with transfer to [facility]. 6/29 EEG showed moderate/severe diffuse encephalopathy, generalized epilepsy. PMH: epilepsy on keppra , polysubstance abuse, remote HSV encephalitis   Clinical Impression  Pt in bed upon arrival with wife present and agreeable to PT eval. PTA, pt was independent for mobility with no AD. In today's session, pt required MinA for bed mobility and MinAx2 for safety to stand and ambulate 20 ft. Distance limited by pt feeling like the room is spinning with VSS. Slight reduction in symptoms with rest in between position changes. Pt would lean in multiple directions when standing and ambulating requiring MinA for stability. Pt has 24/7 level of assist available at home. Pt will have 18 steps to enter home which may be a barrier. Recommending post-acute rehab >3hrs to work towards independence with mobility. Pt would benefit from acute skilled PT with current functional limitations listed below (see PT Problem List). Acute PT to follow.         If plan is discharge home, recommend the following: A lot of help with walking and/or transfers;A lot of help with bathing/dressing/bathroom;Assistance with cooking/housework;Direct supervision/assist for medications management;Assist for transportation;Help with stairs or ramp for entrance   Can travel by private vehicle    Yes    Equipment Recommendations Rolling walker (2 wheels)  Recommendations for Other Services  Rehab consult    Functional Status Assessment Patient has had a recent decline in their functional status and demonstrates the ability to make significant improvements in function in a reasonable  and predictable amount of time.     Precautions / Restrictions Precautions Precautions: Fall Precaution/Restrictions Comments: hx of seizures Restrictions Weight Bearing Restrictions Per Provider Order: No      Mobility  Bed Mobility Overal bed mobility: Needs Assistance Bed Mobility: Supine to Sit, Sit to Supine    Supine to sit: Min assist Sit to supine: Min assist   General bed mobility comments: MinA for trunk support with supine/sit. MinA for LE management with return to supine    Transfers Overall transfer level: Needs assistance Equipment used: Rolling walker (2 wheels) Transfers: Sit to/from Stand Sit to Stand: Min assist, +2 safety/equipment      General transfer comment: MinA for boost-up and steadying assist. Pt leaning in all directions with cues to focus on target and for hand placement with RW. x2 for safety    Ambulation/Gait Ambulation/Gait assistance: Min assist, +2 safety/equipment Gait Distance (Feet): 20 Feet Assistive device: Rolling walker (2 wheels) Gait Pattern/deviations: Step-through pattern, Shuffle, Staggering right, Staggering left Gait velocity: decr     General Gait Details: unsteady gait with pt staggering left/right with MinA needed to maintain balance. Cues for directions and safety when turning with RW     Balance Overall balance assessment: Needs assistance, Mild deficits observed, not formally tested Sitting-balance support: No upper extremity supported, Feet supported Sitting balance-Leahy Scale: Fair     Standing balance support: Bilateral upper extremity supported, During functional activity, Reliant on assistive device for balance Standing balance-Leahy Scale: Poor Standing balance comment: reliant on external and UE support        Pertinent Vitals/Pain Pain Assessment Pain Assessment: Faces Faces Pain Scale: Hurts even more Pain Location: all over Pain  Descriptors / Indicators: Aching, Discomfort Pain  Intervention(s): Limited activity within patient's tolerance, Monitored during session, Repositioned    Home Living Family/patient expects to be discharged to:: Private residence Living Arrangements: Spouse/significant other;Parent (dad) Available Help at Discharge: Family;Available 24 hours/day Type of Home: Apartment Home Access: Stairs to enter Entrance Stairs-Rails: Right Entrance Stairs-Number of Steps: 18 (interior steps)   Home Layout: One level Home Equipment: None      Prior Function Prior Level of Function : Independent/Modified Independent;Working/employed;Driving    ADLs Comments: not currently working, light IADLs, spouse assists with meds. watches TV and enjoys video games     Extremity/Trunk Assessment   Upper Extremity Assessment Upper Extremity Assessment: Defer to OT evaluation    Lower Extremity Assessment Lower Extremity Assessment: Overall WFL for tasks assessed    Cervical / Trunk Assessment Cervical / Trunk Assessment: Normal  Communication   Communication Communication: Impaired Factors Affecting Communication: Reduced clarity of speech (decreased volume)    Cognition Arousal: Alert Behavior During Therapy: Flat affect   PT - Cognitive impairments: Memory, Problem solving, Safety/Judgement      PT - Cognition Comments: pt reported having difficulty remembering. Cues needed for problem solving and safety when ambulating in room. Increased time to respond to questions Following commands: Impaired Following commands impaired: Follows one step commands inconsistently, Follows one step commands with increased time     Cueing Cueing Techniques: Verbal cues, Tactile cues      PT Assessment Patient needs continued PT services  PT Problem List Decreased activity tolerance;Decreased balance;Decreased mobility;Decreased cognition;Decreased knowledge of use of DME;Decreased safety awareness       PT Treatment Interventions DME instruction;Gait  training;Stair training;Functional mobility training;Therapeutic activities;Therapeutic exercise;Balance training;Neuromuscular re-education;Patient/family education    PT Goals (Current goals can be found in the Care Plan section)  Acute Rehab PT Goals Patient Stated Goal: to feel better PT Goal Formulation: With patient/family Time For Goal Achievement: 07/02/24 Potential to Achieve Goals: Good    Frequency Min 3X/week     Co-evaluation   Reason for Co-Treatment: For patient/therapist safety;To address functional/ADL transfers;Complexity of the patient's impairments (multi-system involvement) PT goals addressed during session: Mobility/safety with mobility;Balance;Proper use of DME         AM-PAC PT 6 Clicks Mobility  Outcome Measure Help needed turning from your back to your side while in a flat bed without using bedrails?: A Little Help needed moving from lying on your back to sitting on the side of a flat bed without using bedrails?: A Little Help needed moving to and from a bed to a chair (including a wheelchair)?: A Little Help needed standing up from a chair using your arms (e.g., wheelchair or bedside chair)?: A Little Help needed to walk in hospital room?: A Little Help needed climbing 3-5 steps with a railing? : A Lot 6 Click Score: 17    End of Session Equipment Utilized During Treatment: Gait belt Activity Tolerance: Patient tolerated treatment well Patient left: in bed;with call bell/phone within reach;with bed alarm set;with nursing/sitter in room;with family/visitor present;with SCD's reapplied Nurse Communication: Mobility status PT Visit Diagnosis: Unsteadiness on feet (R26.81);Other abnormalities of gait and mobility (R26.89)    Time: 8991-8966 PT Time Calculation (min) (ACUTE ONLY): 25 min   Charges:   PT Evaluation $PT Eval Moderate Complexity: 1 Mod   PT General Charges $$ ACUTE PT VISIT: 1 Visit       Kate ORN, PT, DPT Secure Chat Preferred   Rehab Office [phone]   Kate  E Matty Vanroekel 06/18/2024, 11:06 AM

## 2024-06-19 ENCOUNTER — Other Ambulatory Visit (HOSPITAL_COMMUNITY): Payer: Self-pay

## 2024-06-19 DIAGNOSIS — R569 Unspecified convulsions: Secondary | ICD-10-CM | POA: Diagnosis not present

## 2024-06-19 LAB — GLUCOSE, CAPILLARY
Glucose-Capillary: 100 mg/dL — ABNORMAL HIGH (ref 70–99)
Glucose-Capillary: 108 mg/dL — ABNORMAL HIGH (ref 70–99)
Glucose-Capillary: 113 mg/dL — ABNORMAL HIGH (ref 70–99)
Glucose-Capillary: 87 mg/dL (ref 70–99)

## 2024-06-19 MED ORDER — CYANOCOBALAMIN 1000 MCG PO TABS
1000.0000 ug | ORAL_TABLET | Freq: Every day | ORAL | 0 refills | Status: AC
  Filled 2024-06-19: qty 90, 90d supply, fill #0

## 2024-06-19 MED ORDER — LEVETIRACETAM 1000 MG PO TABS
2000.0000 mg | ORAL_TABLET | Freq: Two times a day (BID) | ORAL | 0 refills | Status: DC
  Filled 2024-06-19: qty 60, 15d supply, fill #0

## 2024-06-19 NOTE — TOC Transition Note (Signed)
 Transition of Care [facility]) - Discharge Note   Patient Details  Name: Jason [name] MRN: [id number] Date of Birth: 1976/04/09  [facility]) CM/SW Contact:  Andrez JULIANNA George, RN Phone Number: 06/19/2024, 3:44 PM   Clinical Narrative:     Pt has done better today with therapy and they are recommending home with outpatient therapy. They live in [place] and outpt arranged with Zelda Salmon. Information on the AVS.  Walker for home delivered to the room from [employer].  Pt has transportation home.   Final next level of care: OP Rehab Barriers to Discharge: No Barriers Identified   Patient Goals and CMS Choice     Choice offered to / list presented to : Patient, Spouse      Discharge Placement                       Discharge Plan and Services Additional resources added to the After Visit Summary for     Discharge Planning Services: CM Consult            DME Arranged: Vannie rolling DME Agency: [employer] Date DME Agency Contacted: 06/19/24   Representative spoke with at DME Agency: DC lounge            Social Drivers of Health (SDOH) Interventions SDOH Screenings   Food Insecurity: Patient Unable To Answer (01/25/2024)  Housing: Patient Unable To Answer (01/25/2024)  Social Connections: Unknown (04/22/2022)   Received from [facility]  Tobacco Use: Unknown (06/14/2024)     Readmission Risk Interventions     No data to display

## 2024-06-19 NOTE — Progress Notes (Signed)
 Physical Therapy Treatment Patient Details Name: Jason [name] MRN: [id number] DOB: November 17, 1976 Today's Date: 06/19/2024   History of Present Illness 48 y.o. male presents to [facility]14/24 [facility] after having two tonic-clonic type seizures at home. Head CT negative, UDS + for cocaine  and benzodiazepines. Had 3rd seizure in hospital with transfer to [facility]. 6/29 EEG showed moderate/severe diffuse encephalopathy, generalized epilepsy. PMH: epilepsy on keppra , polysubstance abuse, remote HSV encephalitis   PT Comments  Pt in bed upon arrival with wife present and agreeable to PT session. Pt had increased alertness in today's session with ability to follow multi-step commands. With no AD, pt was slightly unsteady with small LOB requiring MinA to correct. When ambulating with RW, pt was able to ambulate >361ft with CGA for safety. He was also able to ascend/descend 8 steps with 1HR and CGA. Pt and wife feel comfortable discharging home at this time. They will d/c to parent's home with 3 steps to enter and 24/7 physical assist available. Discussed utilizing RW for safety with mobility and having close guard with steps and when ambulating. Recommending OP PT to work on balance and functional limitations. Acute PT to follow.    If plan is discharge home, recommend the following: A lot of help with walking and/or transfers;A lot of help with bathing/dressing/bathroom;Assistance with cooking/housework;Direct supervision/assist for medications management;Assist for transportation;Help with stairs or ramp for entrance   Can travel by private vehicle      Yes  Equipment Recommendations  Rolling walker (2 wheels)       Precautions / Restrictions Precautions Precautions: Fall Precaution/Restrictions Comments: hx of seizures Restrictions Weight Bearing Restrictions Per Provider Order: No     Mobility  Bed Mobility Overal bed mobility: Needs Assistance Bed Mobility: Supine to Sit    Supine to sit: Contact  guard    General bed mobility comments: CGA for safety with increased time and effort    Transfers Overall transfer level: Needs assistance Equipment used: Rolling walker (2 wheels), None Transfers: Sit to/from Stand Sit to Stand: Contact guard assist    General transfer comment: CGA for safety    Ambulation/Gait Ambulation/Gait assistance: Min assist, Contact guard assist Gait Distance (Feet): 150 Feet (x40 w/ no AD, x300 w/ RW) Assistive device: Rolling walker (2 wheels) Gait Pattern/deviations: Step-through pattern, Shuffle, Staggering right, Staggering left Gait velocity: decr     General Gait Details: slightly unsteady with no AD with MinA to stabilize. CGA with RW   Stairs Stairs: Yes Stairs assistance: Contact guard assist Stair Management: One rail Right, Alternating pattern, Forwards Number of Stairs: 8 General stair comments: steady with use of 1HR, CGA for safety     Balance Overall balance assessment: Needs assistance, Mild deficits observed, not formally tested Sitting-balance support: No upper extremity supported, Feet supported Sitting balance-Leahy Scale: Fair     Standing balance support: Bilateral upper extremity supported, During functional activity, Reliant on assistive device for balance Standing balance-Leahy Scale: Poor Standing balance comment: reliant on external and UE support       Communication Communication Communication: Impaired Factors Affecting Communication: Reduced clarity of speech (decreased volume)  Cognition Arousal: Alert Behavior During Therapy: Flat affect   PT - Cognitive impairments: Problem solving, Safety/Judgement    Following commands: Impaired Following commands impaired: Follows multi-step commands with increased time    Cueing Cueing Techniques: Verbal cues, Tactile cues         Pertinent Vitals/Pain Pain Assessment Pain Assessment: No/denies pain     PT Goals (  current goals can now be found in the  care plan section) Acute Rehab PT Goals Patient Stated Goal: to feel better PT Goal Formulation: With patient/family Time For Goal Achievement: 07/02/24 Potential to Achieve Goals: Good Progress towards PT goals: Progressing toward goals    Frequency    Min 3X/week       AM-PAC PT 6 Clicks Mobility   Outcome Measure  Help needed turning from your back to your side while in a flat bed without using bedrails?: A Little Help needed moving from lying on your back to sitting on the side of a flat bed without using bedrails?: A Little Help needed moving to and from a bed to a chair (including a wheelchair)?: A Little Help needed standing up from a chair using your arms (e.g., wheelchair or bedside chair)?: A Little Help needed to walk in hospital room?: A Little Help needed climbing 3-5 steps with a railing? : A Little 6 Click Score: 18    End of Session Equipment Utilized During Treatment: Gait belt Activity Tolerance: Patient tolerated treatment well Patient left: with call bell/phone within reach;with family/visitor present;in chair Nurse Communication: Mobility status PT Visit Diagnosis: Unsteadiness on feet (R26.81);Other abnormalities of gait and mobility (R26.89)     Time: 8554-8487 PT Time Calculation (min) (ACUTE ONLY): 27 min  Charges:    $Gait Training: 8-22 mins $Therapeutic Activity: 8-22 mins PT General Charges $$ ACUTE PT VISIT: 1 Visit                     Kate ORN, PT, DPT Secure Chat Preferred  Rehab Office [phone]   Kate BRAVO Wendolyn 06/19/2024, 3:32 PM

## 2024-06-19 NOTE — Progress Notes (Signed)
Discharged to home after IV access removed and discharge instructions given to pt and wife.

## 2024-06-19 NOTE — TOC Progression Note (Signed)
 Transition of Care [facility]) - Progression Note    Patient Details  Name: Jason [name] MRN: [id number] Date of Birth: [birth year]  Transition of Care [facility]) CM/SW Contact  Andrez JULIANNA George, RN Phone Number: 06/19/2024, 10:49 AM  Clinical Narrative:     CIR has declined for rehab. CM spoke to pt and his spouse at the bedside. They are agreeable to being faxed out for SNF. They are hoping he will improve enough to dc home.  Awaiting bed offers.  TOC following.  Expected Discharge Plan: Skilled Nursing Facility Barriers to Discharge: Continued Medical Work up  Expected Discharge Plan and Services   Discharge Planning Services: CM Consult   Living arrangements for the past 2 months: Apartment                                       Social Determinants of Health (SDOH) Interventions SDOH Screenings   Food Insecurity: Patient Unable To Answer (01/25/2024)  Housing: Patient Unable To Answer (01/25/2024)  Social Connections: Unknown (04/22/2022)   Received from [facility]  Tobacco Use: Unknown (06/14/2024)    Readmission Risk Interventions     No data to display

## 2024-06-19 NOTE — Progress Notes (Signed)
 [facility] PROGRESS NOTE   Jason [name] FMW:[id number] DOB: 1976-08-12 DOA: 06/14/2024  PCP: Patient, No Pcp Per  Brief History: 48 year old male with past medical history of seizures who presented to [facility] on 6/28 with seizures. Information obtained from wife in ED who reported seizure around 0230. He returned to baseline and was able to take his Keppra  dose around 0430. He then had second tonic-clonic seizure at 0730.  Patient continued to have multiple seizure activities and so he was transferred to [facility] ICU.  He was stabilized and then transferred to the floor.     Consultants: Neurology.  Critical care medicine  Procedures: EEG    Subjective/Interval History: Patient noted to be little bit more awake alert this morning.  Denies any complaints.  No seizure activity reported.    Assessment/Plan:  Status epilepticus in the setting of a history of seizures Had multiple seizures while being hospitalized.  Was monitored in the ICU.  Seen by neurology. It is felt that his cocaine  abuse may have contributed to breakthrough seizures.  There was also some concern for missing a dose of Keppra  at home. Patient was seen by neurology.   Continue Keppra . Neurological status is stable.  Seen by physical therapy and they feel the patient needs rehab.  TOC consulted.  Not a candidate for CIR.  B12 deficiency Low B12 noted on labs.  Started on B12 supplementation.  Hypotension Likely multifactorial.  He was given IV fluids in the ICU. Blood pressure has improved. Lactic acidosis has improved.  Concern for aspiration pneumonia Noted to be on Zosyn.  Was on Unasyn previously.  Respiratory status is stable. Chest x-ray from 6/29 did not suggest any pneumonia. Procalcitonin level is less than 0.1.  WBC is 6.5.  Blood cultures without any growth.  Zosyn was discontinued.  Respiratory status is stable.  Hypoglycemia Due to poor oral intake.  Improved after he was  given something to eat.  Polysubstance abuse Urine drug screen positive for benzos and cocaine .  He was counseled.  Normocytic anemia Stable.   DVT Prophylaxis: Lovenox  Code Status: Full code Family Communication: Discussed with patient and his spouse Disposition Plan: CIR was recommended however not a candidate for same.  SNF being pursued.       Medications: Scheduled:  Chlorhexidine  Gluconate Cloth  6 each Topical Q0600   vitamin B-12  1,000 mcg Oral Daily   enoxaparin  (LOVENOX ) injection  40 mg Subcutaneous Q24H   levETIRAcetam   2,000 mg Oral BID   Continuous:   PRN:acetaminophen , docusate sodium , HYDROmorphone (DILAUDID) injection, ketorolac , ondansetron  (ZOFRAN ) IV, mouth rinse, oxyCODONE , polyethylene glycol  Antibiotics: Anti-infectives (From admission, onward)    Start     Dose/Rate Route Frequency Ordered Stop   06/16/24 1715  piperacillin-tazobactam (ZOSYN) IVPB 3.375 g  Status:  Discontinued        3.375 g 12.5 mL/hr over 240 Minutes Intravenous Every 8 hours 06/16/24 1629 06/18/24 0941   06/14/24 1900  Ampicillin-Sulbactam (UNASYN) 3 g in sodium chloride  0.9 % 100 mL IVPB  Status:  Discontinued        3 g 200 mL/hr over 30 Minutes Intravenous Every 6 hours 06/14/24 1805 06/16/24 1626       Objective:  Vital Signs  Vitals:   06/18/24 2351 06/19/24 0350 06/19/24 0420 06/19/24 0747  BP: 129/77 115/84  126/72  Pulse: 69 72  68  Resp: 14 18  19   Temp: 98.4 F (36.9 C) 98.6 F (37 C)  (!)  97.5 F (36.4 C)  TempSrc: Oral Oral  Oral  SpO2: 97% 99%  98%  Weight:   83 kg   Height:        Intake/Output Summary (Last 24 hours) at 06/19/2024 1053 Last data filed at 06/19/2024 0400 Gross per 24 hour  Intake --  Output 700 ml  Net -700 ml   Filed Weights   06/17/24 0500 06/18/24 1237 06/19/24 0420  Weight: 71.3 kg 82.8 kg 83 kg    General appearance: Awake alert.  In no distress Resp: Clear to auscultation bilaterally.  Normal effort Cardio: S1-S2  is normal regular.  No S3-S4.  No rubs murmurs or bruit GI: Abdomen is soft.  Nontender nondistended.  Bowel sounds are present normal.  No masses organomegaly No focal neurological deficits.  Lab Results:  Data Reviewed: I have personally reviewed following labs and reports of the imaging studies  CBC: Recent Labs  Lab 06/14/24 0820 06/15/24 0707 06/16/24 0959 06/17/24 0921 06/18/24 0453  WBC 12.3* 11.5* 7.8 6.3 6.5  NEUTROABS 10.7*  --   --   --   --   HGB 15.2 14.0 13.2 12.1* 12.8*  HCT 42.3 39.6 38.4* 35.2* 36.1*  MCV 82.5 81.8 85.0 84.6 80.8  PLT 298 286 242 230 244    Basic Metabolic Panel: Recent Labs  Lab 06/14/24 2021 06/15/24 0707 06/16/24 0959 06/17/24 0921 06/18/24 0453  NA 137 136 138 139 136  K 4.0 3.3* 4.0 4.0 3.7  CL 104 103 105 103 101  CO2 18* 24 22 28 25   GLUCOSE 144* 108* 78 84 103*  BUN 14 15 21* 13 10  CREATININE 1.04 0.92 1.00 0.92 0.89  CALCIUM 10.0 8.9 9.1 8.4* 8.9  MG 2.5*  --  2.3  --   --   PHOS  --   --  3.4  --   --     GFR: Estimated Creatinine Clearance: 107.7 mL/min (by C-G formula based on SCr of 0.89 mg/dL).  Liver Function Tests: Recent Labs  Lab 06/14/24 0820 06/15/24 0707 06/17/24 0921  AST 35 19 24  ALT 26 17 18   ALKPHOS 77 58 48  BILITOT 0.9 1.6* 0.5  PROT 8.4* 7.4 5.9*  ALBUMIN 4.2 3.8 2.9*    Cardiac Enzymes: Recent Labs  Lab 06/16/24 1913  CKTOTAL 28*    HbA1C: Recent Labs    06/16/24 1654  HGBA1C 5.0    CBG: Recent Labs  Lab 06/18/24 1609 06/18/24 2056 06/18/24 2355 06/19/24 0349 06/19/24 0746  GLUCAP 113* 125* 100* 113* 87     Recent Results (from the past 240 hours)  MRSA Next Gen by PCR, Nasal     Status: None   Collection Time: 06/14/24  6:21 PM   Specimen: Nasal Mucosa; Nasal Swab  Result Value Ref Range Status   MRSA by PCR Next Gen NOT DETECTED NOT DETECTED Final    Comment: (NOTE) The GeneXpert MRSA Assay (FDA approved for NASAL specimens only), is one component of a  comprehensive MRSA colonization surveillance program. It is not intended to diagnose MRSA infection nor to guide or monitor treatment for MRSA infections. Test performance is not FDA approved in patients less than 15 years old. Performed at [facility], [address], [place], [place] [address]   MRSA Next Gen by PCR, Nasal     Status: None   Collection Time: 06/14/24 11:55 PM   Specimen: Nasal Mucosa; Nasal Swab  Result Value Ref Range Status   MRSA  by PCR Next Gen NOT DETECTED NOT DETECTED Final    Comment: (NOTE) The GeneXpert MRSA Assay (FDA approved for NASAL specimens only), is one component of a comprehensive MRSA colonization surveillance program. It is not intended to diagnose MRSA infection nor to guide or monitor treatment for MRSA infections. Test performance is not FDA approved in patients less than 41 years old. Performed at [facility], [address] [place], [place] [address]   Culture, blood (Routine X 2) w Reflex to ID Panel     Status: None (Preliminary result)   Collection Time: 06/16/24  4:54 PM   Specimen: BLOOD  Result Value Ref Range Status   Specimen Description BLOOD SITE NOT SPECIFIED  Final   Special Requests   Final    BOTTLES DRAWN AEROBIC ONLY Blood Culture adequate volume   Culture   Final    NO GROWTH 3 DAYS Performed at [facility], [address] [place], [place] [address]    Report Status PENDING  Incomplete  Culture, blood (Routine X 2) w Reflex to ID Panel     Status: None (Preliminary result)   Collection Time: 06/16/24  5:29 PM   Specimen: BLOOD  Result Value Ref Range Status   Specimen Description BLOOD SITE NOT SPECIFIED  Final   Special Requests   Final    BOTTLES DRAWN AEROBIC ONLY Blood Culture results may not be optimal due to an inadequate volume of blood received in culture bottles   Culture   Final    NO GROWTH 3 DAYS Performed at [facility], [address], [place], [place] [address]    Report  Status PENDING  Incomplete      Radiology Studies: No results found.     LOS: 4 days   Nissim Fleischer [employer] on [email or web address]  06/19/2024, 10:53 AM

## 2024-06-19 NOTE — [place] FL2 (Signed)
 [place]  MEDICAID FL2 LEVEL OF CARE FORM     IDENTIFICATION  Patient Name: Jason [name] Birthdate: 1976-10-23 Sex: male Admission Date (Current Location): 06/14/2024  [facility] and [place] Number:  [employer] and Address:  [facility], [address] [place], [place] [address]      Provider Number: [id number]  Attending Physician Name and Address:  Verdene Purchase, MD  Relative Name and Phone Number:  Kyron, Schlitt [phone]  [phone]    Current Level of Care: Hospital Recommended Level of Care: Skilled Nursing Facility Prior Approval Number:    Date Approved/Denied:   PASRR Number: [id number]  Discharge Plan: SNF    Current Diagnoses: Patient Active Problem List   Diagnosis Date Noted   Cocaine  abuse (HCC) 06/14/2024   Seizure (HCC) 01/26/2024   Status epilepticus (HCC) 01/25/2024    Orientation RESPIRATION BLADDER Height & Weight     Self, Time, Situation, Place  Normal Continent Weight: 83 kg Height:  5' 8 (172.7 cm)  BEHAVIORAL SYMPTOMS/MOOD NEUROLOGICAL BOWEL NUTRITION STATUS    Convulsions/Seizures Continent Diet (regular with thins)  AMBULATORY STATUS COMMUNICATION OF NEEDS Skin   Extensive Assist Verbally Other (Comment) (redness to bilateral buttocks/ blister to LUA)                       Personal Care Assistance Level of Assistance  Bathing, Feeding, Dressing Bathing Assistance: Maximum assistance Feeding assistance: Limited assistance Dressing Assistance: Maximum assistance     Functional Limitations Info  Sight, Hearing, Speech Sight Info: Adequate Hearing Info: Adequate Speech Info: Impaired (slur)    SPECIAL CARE FACTORS FREQUENCY  PT (By licensed PT), OT (By licensed OT), Speech therapy     PT Frequency: 5x/wk OT Frequency: 5x/wk     Speech Therapy Frequency: 5x/wk      Contractures Contractures Info: Not present    Additional Factors Info  Code Status, Allergies,  Psychotropic Code Status Info: Full Allergies Info: Hydrocodone-acetaminophen  Psychotropic Info: Keppra  2000 mg BID         Current Medications (06/19/2024):  This is the current hospital active medication list Current Facility-Administered Medications  Medication Dose Route Frequency Provider Last Rate Last Admin   acetaminophen  (TYLENOL ) tablet 650 mg  650 mg Oral Q4H PRN Autry, Lauren E, PA-C       Chlorhexidine  Gluconate Cloth 2 % PADS 6 each  6 each Topical Q0600 Tat, David, MD   6 each at 06/19/24 9491   cyanocobalamin (VITAMIN B12) tablet 1,000 mcg  1,000 mcg Oral Daily Bhagat, Srishti L, MD   1,000 mcg at 06/19/24 0911   docusate sodium  (COLACE) capsule 100 mg  100 mg Oral BID PRN Autry, Lauren E, PA-C       enoxaparin  (LOVENOX ) injection 40 mg  40 mg Subcutaneous Q24H Autry, Lauren E, PA-C   40 mg at 06/18/24 2101   HYDROmorphone (DILAUDID) injection 0.5 mg  0.5 mg Intravenous Q4H PRN Volanda, Rutwij, MD   0.5 mg at 06/17/24 2352   ketorolac  (TORADOL ) 15 MG/ML injection 15 mg  15 mg Intravenous Q6H PRN Paliwal, Aditya, MD   15 mg at 06/16/24 1615   levETIRAcetam  (KEPPRA ) tablet 2,000 mg  2,000 mg Oral BID Krishnan, Gokul, MD   2,000 mg at 06/19/24 0911   ondansetron  (ZOFRAN ) injection 4 mg  4 mg Intravenous Q6H PRN Autry, Lauren E, PA-C       Oral care mouth rinse  15 mL Mouth Rinse PRN  Maree Harder, MD       oxyCODONE  (Oxy IR/ROXICODONE ) immediate release tablet 10 mg  10 mg Oral Q6H PRN Volanda Sender, MD   10 mg at 06/19/24 0910   polyethylene glycol (MIRALAX  / GLYCOLAX ) packet 17 g  17 g Oral Daily PRN Autry, Lauren E, PA-C   17 g at 06/17/24 1703     Discharge Medications: Please see discharge summary for a list of discharge medications.  Relevant Imaging Results:  Relevant Lab Results:   Additional Information SS#:  [id number]  Andrez JULIANNA George, RN

## 2024-06-19 NOTE — Discharge Summary (Signed)
 [facility]  Physician Discharge Summary   Patient ID: Jason [name] MRN: [id number] DOB/AGE: [birth year] 48 y.o.  Admit date: 06/14/2024 Discharge date: 06/19/2024    PCP: Jason [name]  DISCHARGE DIAGNOSES:  Principal Problem:   Seizure (HCC) Active Problems:   Cocaine  abuse (HCC)   RECOMMENDATIONS FOR OUTPATIENT FOLLOW UP: Follow-up with PCP   Home Health: Outpatient PT Equipment/Devices: Rolling walker  CODE STATUS: Full code  DISCHARGE CONDITION: fair  Diet recommendation: Regular as tolerated  INITIAL HISTORY: 48 year old male with past medical history of seizures who presented to [facility] on 6/28 with seizures. Information obtained from wife in ED who reported seizure around 0230. He returned to baseline and was able to take his Keppra  dose around 0430. He then had second tonic-clonic seizure at 0730.  Patient continued to have multiple seizure activities and so he was transferred to [facility] ICU.  He was stabilized and then transferred to the floor.      Consultants: Neurology.  Critical care medicine   Procedures: EEG  HOSPITAL COURSE:   Status epilepticus in the setting of a history of seizures Had multiple seizures while being hospitalized.  Was monitored in the ICU.  Seen by neurology. It is felt that his cocaine  abuse may have contributed to breakthrough seizures.  There was also some concern for missing a dose of Keppra  at home. Patient was seen by neurology.   Continue Keppra . Neurological status is stable.   Seen by physical therapy.  Initially deconditioned but when he improved.  Will be going home with family.   B12 deficiency Low B12 noted on labs.  Started on B12 supplementation.   Hypotension Likely multifactorial.  He was given IV fluids in the ICU. Blood pressure has improved. Lactic acidosis has improved.   Concern for aspiration pneumonia Noted to be on Zosyn .  Was on Unasyn  previously.  Respiratory status is  stable. Chest x-ray from 6/29 did not suggest any pneumonia. Procalcitonin level is less than 0.1.  WBC is 6.5.  Blood cultures without any growth.  Zosyn  was discontinued.  Respiratory status is stable.   Hypoglycemia Due to poor oral intake.  Improved after he was given something to eat.   Polysubstance abuse Urine drug screen positive for benzos and cocaine .  He was counseled.   Normocytic anemia Stable.  Patient is stable.  Okay for discharge home today.   PERTINENT LABS:  The results of significant diagnostics from this hospitalization (including imaging, microbiology, ancillary and laboratory) are listed below for reference.    Microbiology: Recent Results (from the past 240 hours)  MRSA Next Gen by PCR, Nasal     Status: None   Collection Time: 06/14/24  6:21 PM   Specimen: Nasal Mucosa; Nasal Swab  Result Value Ref Range Status   MRSA by PCR Next Gen NOT DETECTED NOT DETECTED Final    Comment: (NOTE) The GeneXpert MRSA Assay (FDA approved for NASAL specimens only), is one component of a comprehensive MRSA colonization surveillance program. It is not intended to diagnose MRSA infection nor to guide or monitor treatment for MRSA infections. Test performance is not FDA approved in patients less than 43 years old. Performed at [facility], [address], [place], [place] [address]   MRSA Next Gen by PCR, Nasal     Status: None   Collection Time: 06/14/24 11:55 PM   Specimen: Nasal Mucosa; Nasal Swab  Result Value Ref Range Status   MRSA by PCR Next Gen NOT  DETECTED NOT DETECTED Final    Comment: (NOTE) The GeneXpert MRSA Assay (FDA approved for NASAL specimens only), is one component of a comprehensive MRSA colonization surveillance program. It is not intended to diagnose MRSA infection nor to guide or monitor treatment for MRSA infections. Test performance is not FDA approved in patients less than 42 years old. Performed at [facility], [address]  [place], [place] [address]   Culture, blood (Routine X 2) w Reflex to ID Panel     Status: None (Preliminary result)   Collection Time: 06/16/24  4:54 PM   Specimen: BLOOD  Result Value Ref Range Status   Specimen Description BLOOD SITE NOT SPECIFIED  Final   Special Requests   Final    BOTTLES DRAWN AEROBIC ONLY Blood Culture adequate volume   Culture   Final    NO GROWTH 4 DAYS Performed at [facility], [address], [place], [place] [address]    Report Status PENDING  Incomplete  Culture, blood (Routine X 2) w Reflex to ID Panel     Status: None (Preliminary result)   Collection Time: 06/16/24  5:29 PM   Specimen: BLOOD  Result Value Ref Range Status   Specimen Description BLOOD SITE NOT SPECIFIED  Final   Special Requests   Final    BOTTLES DRAWN AEROBIC ONLY Blood Culture results may not be optimal due to an inadequate volume of blood received in culture bottles   Culture   Final    NO GROWTH 4 DAYS Performed at [facility], [address] [place], [place] [address]    Report Status PENDING  Incomplete     Labs:   Basic Metabolic Panel: Recent Labs  Lab 06/14/24 2021 06/15/24 0707 06/16/24 0959 06/17/24 0921 06/18/24 0453  NA 137 136 138 139 136  K 4.0 3.3* 4.0 4.0 3.7  CL 104 103 105 103 101  CO2 18* 24 22 28 25   GLUCOSE 144* 108* 78 84 103*  BUN 14 15 21* 13 10  CREATININE 1.04 0.92 1.00 0.92 0.89  CALCIUM 10.0 8.9 9.1 8.4* 8.9  MG 2.5*  --  2.3  --   --   PHOS  --   --  3.4  --   --    Liver Function Tests: Recent Labs  Lab 06/14/24 0820 06/15/24 0707 06/17/24 0921  AST 35 19 24  ALT 26 17 18   ALKPHOS 77 58 48  BILITOT 0.9 1.6* 0.5  PROT 8.4* 7.4 5.9*  ALBUMIN 4.2 3.8 2.9*    CBC: Recent Labs  Lab 06/14/24 0820 06/15/24 0707 06/16/24 0959 06/17/24 0921 06/18/24 0453  WBC 12.3* 11.5* 7.8 6.3 6.5  NEUTROABS 10.7*  --   --   --   --   HGB 15.2 14.0 13.2 12.1* 12.8*  HCT 42.3 39.6 38.4* 35.2* 36.1*  MCV 82.5 81.8 85.0 84.6  80.8  PLT 298 286 242 230 244   Cardiac Enzymes: Recent Labs  Lab 06/16/24 1913  CKTOTAL 28*    CBG: Recent Labs  Lab 06/18/24 2056 06/18/24 2355 06/19/24 0349 06/19/24 0746 06/19/24 1302  GLUCAP 125* 100* 113* 87 108*     IMAGING STUDIES Overnight EEG with video Result Date: 06/15/2024 Shelton Jason KIDD, MD     06/15/2024 11:45 AM Patient Name: Jason [name] MRN: [id number] Epilepsy Attending: Arlin [name] Shelton Referring Physician/Provider: Jerrie Lola CROME, MD Duration: 06/15/2024 0049 to 1120 Patient history: 48 y.o. male with a  past medical history significant for epilepsy with provoked seizures in the setting of cocaine  abuse.  EEG to evaluate for seizure Level of alertness: comatose/ lethargic AEDs during EEG study: LEV, Ativan  Technical aspects: This EEG study was done with scalp electrodes positioned according to the 10-20 International system of electrode placement. Electrical activity was reviewed with band pass filter of 1-70Hz , sensitivity of 7 uV/mm, display speed of 68mm/sec with a 60Hz  notched filter applied as appropriate. EEG data were recorded continuously and digitally stored.  Video monitoring was available and reviewed as appropriate. Description: EEG showed continuous generalized 3 to 6 Hz theta-delta slowing admixed with 15 to 18 Hz, 2-3 uV beta activity distributed symmetrically and diffusely. Generalized polyspikes were also noted.  Hyperventilation and photic stimulation were not performed.   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 Additionally there moderate to severe diffuse encephalopathy. No seizures were seen throughout the recording. Jason MALVA Krebs   DG CHEST PORT 1 VIEW Result Date: 06/14/2024 CLINICAL DATA:  Altered mental status. EXAM: PORTABLE CHEST 1 VIEW COMPARISON:  01/26/2024 FINDINGS: Lung volumes are low. Stable heart size and  mediastinal contours. There is no focal airspace disease. No pleural effusion or pneumothorax. No acute osseous findings. IMPRESSION: Low lung volumes without acute chest finding. Electronically Signed   By: Andrea Gasman M.D.   On: 06/14/2024 23:47   CT Head Wo Contrast Result Date: 06/14/2024 CLINICAL DATA:  48 year old male with recurrent seizure like activity this morning. EXAM: CT HEAD WITHOUT CONTRAST TECHNIQUE: Contiguous axial images were obtained from the base of the skull through the vertex without intravenous contrast. RADIATION DOSE REDUCTION: This exam was performed according to the departmental dose-optimization program which includes automated exposure control, adjustment of the mA and/or kV according to patient size and/or use of iterative reconstruction technique. COMPARISON:  Head CT 01/25/2024. FINDINGS: Brain: Cerebral volume is stable and within normal limits. No midline shift, ventriculomegaly, mass effect, evidence of mass lesion, intracranial hemorrhage or evidence of cortically based acute infarction. Gray-white matter differentiation is within normal limits throughout the brain. Vascular: Generalized increase in density of the vasculature compared to February. No suspicious intracranial vascular hyperdensity. Skull: No acute osseous abnormality identified. Sinuses/Orbits: Improved bilateral paranasal sinus aeration since February. Tympanic cavities and mastoids appear clear. Other: Visualized orbits and scalp soft tissues are within normal limits. IMPRESSION: Stable and negative noncontrast CT appearance of the brain. Electronically Signed   By: VEAR Hurst M.D.   On: 06/14/2024 09:22    DISCHARGE EXAMINATION: See progress note from earlier today  DISPOSITION: Home with family  Discharge Instructions     Ambulatory referral to Occupational Therapy   Complete by: As directed    Ambulatory referral to Physical Therapy   Complete by: As directed    Call MD for:  difficulty  breathing, headache or visual disturbances   Complete by: As directed    Call MD for:  extreme fatigue   Complete by: As directed    Call MD for:  persistant dizziness or light-headedness   Complete by: As directed    Call MD for:  persistant nausea and vomiting   Complete by: As directed    Call MD for:  severe uncontrolled pain   Complete by: As directed    Call MD for:  temperature >100.4   Complete by: As directed    Diet general   Complete by: As directed    Discharge instructions  Complete by: As directed    Please take your medications as prescribed.   You were cared for by a hospitalist during your hospital stay. If you have any questions about your discharge medications or the care you received while you were in the hospital after you are discharged, you can call the unit and asked to speak with the hospitalist on call if the hospitalist that took care of you is not available. Once you are discharged, your primary care physician will handle any further medical issues. Please note that NO REFILLS for any discharge medications will be authorized once you are discharged, as it is imperative that you return to your primary care physician (or establish a relationship with a primary care physician if you do not have one) for your aftercare needs so that they can reassess your need for medications and monitor your lab values. If you do not have a primary care physician, you can call [phone] for a physician referral.   Increase activity slowly   Complete by: As directed          Allergies as of 06/19/2024       Reactions   Hydrocodone-acetaminophen  Nausea And Vomiting        Medication List     TAKE these medications    cyanocobalamin  1000 MCG tablet Commonly known as: VITAMIN B12 Take 1 tablet (1,000 mcg total) by mouth daily.   levETIRAcetam  1000 MG tablet Commonly known as: KEPPRA  Take 2 tablets (2,000 mg total) by mouth 2 (two) times daily.   Nurtec 75 MG  Tbdp Generic drug: Rimegepant Sulfate Take 1 tablet by mouth as needed (migraine).   oxyCODONE -acetaminophen  10-325 MG tablet Commonly known as: PERCOCET Take 1 tablet by mouth every 8 (eight) hours as needed for pain.          Follow-up Information     [facility] Follow up on 08/06/2024.   Why: Your appointment is at 2:30 pm. Please arrive 15 min early and bring a picture ID, current medications and insurance card. Contact information: [address], [place], [place] [address]  Phone: [phone]        [facility]. Schedule an appointment as soon as possible for a visit.   Specialty: Rehabilitation Contact information: [address] [place]  [address] [phone]                TOTAL DISCHARGE TIME: 35 minutes  Debara Kamphuis  [facility] Pager on [email or web address]  06/20/2024, 11:54 AM

## 2024-06-21 LAB — CULTURE, BLOOD (ROUTINE X 2)
Culture: NO GROWTH
Culture: NO GROWTH
Special Requests: ADEQUATE

## 2024-06-23 LAB — GLUCOSE, CAPILLARY: Glucose-Capillary: 72 mg/dL (ref 70–99)

## 2024-08-06 ENCOUNTER — Ambulatory Visit: Payer: Self-pay | Admitting: Family Medicine

## 2024-08-07 ENCOUNTER — Encounter: Payer: Self-pay | Admitting: General Practice

## 2024-10-03 ENCOUNTER — Observation Stay (HOSPITAL_COMMUNITY)
Admission: EM | Admit: 2024-10-03 | Discharge: 2024-10-05 | Disposition: A | Discharge status: Home / Self Care | Attending: Internal Medicine | Admitting: Internal Medicine

## 2024-10-03 ENCOUNTER — Encounter (HOSPITAL_COMMUNITY): Payer: Self-pay | Admitting: Emergency Medicine

## 2024-10-03 ENCOUNTER — Emergency Department (HOSPITAL_COMMUNITY)

## 2024-10-03 ENCOUNTER — Other Ambulatory Visit: Payer: Self-pay

## 2024-10-03 DIAGNOSIS — E872 Acidosis, unspecified: Secondary | ICD-10-CM | POA: Insufficient documentation

## 2024-10-03 DIAGNOSIS — K219 Gastro-esophageal reflux disease without esophagitis: Secondary | ICD-10-CM | POA: Insufficient documentation

## 2024-10-03 DIAGNOSIS — Z862 Personal history of diseases of the blood and blood-forming organs and certain disorders involving the immune mechanism: Secondary | ICD-10-CM | POA: Insufficient documentation

## 2024-10-03 DIAGNOSIS — G894 Chronic pain syndrome: Secondary | ICD-10-CM | POA: Insufficient documentation

## 2024-10-03 DIAGNOSIS — Z79899 Other long term (current) drug therapy: Secondary | ICD-10-CM | POA: Insufficient documentation

## 2024-10-03 DIAGNOSIS — F1920 Other psychoactive substance dependence, uncomplicated: Secondary | ICD-10-CM | POA: Insufficient documentation

## 2024-10-03 DIAGNOSIS — F1410 Cocaine abuse, uncomplicated: Secondary | ICD-10-CM | POA: Diagnosis not present

## 2024-10-03 DIAGNOSIS — F1210 Cannabis abuse, uncomplicated: Secondary | ICD-10-CM | POA: Insufficient documentation

## 2024-10-03 DIAGNOSIS — G40901 Epilepsy, unspecified, not intractable, with status epilepticus: Secondary | ICD-10-CM | POA: Diagnosis not present

## 2024-10-03 DIAGNOSIS — G40919 Epilepsy, unspecified, intractable, without status epilepticus: Secondary | ICD-10-CM | POA: Diagnosis present

## 2024-10-03 DIAGNOSIS — R569 Unspecified convulsions: Principal | ICD-10-CM

## 2024-10-03 DIAGNOSIS — R4182 Altered mental status, unspecified: Secondary | ICD-10-CM | POA: Diagnosis present

## 2024-10-03 LAB — CBC WITH DIFFERENTIAL/PLATELET
Abs Immature Granulocytes: 0.05 K/uL (ref 0.00–0.07)
Basophils Absolute: 0.1 K/uL (ref 0.0–0.1)
Basophils Relative: 1 %
Eosinophils Absolute: 0.1 K/uL (ref 0.0–0.5)
Eosinophils Relative: 1 %
HCT: 43.1 % (ref 39.0–52.0)
Hemoglobin: 14.8 g/dL (ref 13.0–17.0)
Immature Granulocytes: 0 %
Lymphocytes Relative: 8 %
Lymphs Abs: 0.9 K/uL (ref 0.7–4.0)
MCH: 29.2 pg (ref 26.0–34.0)
MCHC: 34.3 g/dL (ref 30.0–36.0)
MCV: 85 fL (ref 80.0–100.0)
Monocytes Absolute: 0.7 K/uL (ref 0.1–1.0)
Monocytes Relative: 5 %
Neutro Abs: 10.7 K/uL — ABNORMAL HIGH (ref 1.7–7.7)
Neutrophils Relative %: 85 %
Platelets: 300 K/uL (ref 150–400)
RBC: 5.07 MIL/uL (ref 4.22–5.81)
RDW: 12.6 % (ref 11.5–15.5)
WBC: 12.5 K/uL — ABNORMAL HIGH (ref 4.0–10.5)
nRBC: 0 % (ref 0.0–0.2)

## 2024-10-03 LAB — URINALYSIS, ROUTINE W REFLEX MICROSCOPIC
Bilirubin Urine: NEGATIVE
Glucose, UA: NEGATIVE mg/dL
Hgb urine dipstick: NEGATIVE
Ketones, ur: NEGATIVE mg/dL
Leukocytes,Ua: NEGATIVE
Nitrite: NEGATIVE
Protein, ur: NEGATIVE mg/dL
Specific Gravity, Urine: 1.011 (ref 1.005–1.030)
pH: 6 (ref 5.0–8.0)

## 2024-10-03 LAB — COMPREHENSIVE METABOLIC PANEL WITH GFR
ALT: 21 U/L (ref 0–44)
AST: 29 U/L (ref 15–41)
Albumin: 4.6 g/dL (ref 3.5–5.0)
Alkaline Phosphatase: 73 U/L (ref 38–126)
Anion gap: 15 (ref 5–15)
BUN: 13 mg/dL (ref 6–20)
CO2: 24 mmol/L (ref 22–32)
Calcium: 9.5 mg/dL (ref 8.9–10.3)
Chloride: 100 mmol/L (ref 98–111)
Creatinine, Ser: 0.98 mg/dL (ref 0.61–1.24)
GFR, Estimated: >60 mL/min (ref >60)
Glucose, Bld: 104 mg/dL — ABNORMAL HIGH (ref 70–99)
Potassium: 3.7 mmol/L (ref 3.5–5.1)
Sodium: 139 mmol/L (ref 135–145)
Total Bilirubin: 0.3 mg/dL (ref 0.0–1.2)
Total Protein: 8 g/dL (ref 6.5–8.1)

## 2024-10-03 LAB — URINE DRUG SCREEN
Amphetamines: NEGATIVE
Barbiturates: NEGATIVE
Benzodiazepines: POSITIVE — AB
Cocaine: POSITIVE — AB
Fentanyl: NEGATIVE
Methadone Scn, Ur: NEGATIVE
Opiates: NEGATIVE
Tetrahydrocannabinol: POSITIVE — AB

## 2024-10-03 MED ORDER — LEVETIRACETAM (KEPPRA) 500 MG/5 ML ADULT IV PUSH
1000.0000 mg | Freq: Once | INTRAVENOUS | Status: AC
  Administered 2024-10-03: 1000 mg via INTRAVENOUS
  Filled 2024-10-03: qty 10

## 2024-10-03 MED ORDER — LORAZEPAM 2 MG/ML IJ SOLN
2.0000 mg | Freq: Once | INTRAMUSCULAR | Status: AC
  Administered 2024-10-03: 2 mg via INTRAVENOUS
  Filled 2024-10-03: qty 1

## 2024-10-03 NOTE — ED Notes (Signed)
 Pt attempting to get out of bed. Continues just to make noises

## 2024-10-03 NOTE — ED Provider Notes (Signed)
 [place] EMERGENCY DEPARTMENT AT [facility] Provider Note   CSN: [id number] Arrival date & time: 10/03/24  2124     Patient presents with: Seizures   Jason [name] is a 48 y.o. male.  He has a history of seizure disorder.  He is brought in by ambulance from home after 2 witnessed by family seizures lasting 1 to 2 minutes.  Reportedly has been compliant with medication.  Patient here is confused and not able to give a history.  Level 5 caveat secondary to altered mental status.  Blood sugar by EMS was 104.  {Add pertinent medical, surgical, social history, OB history to YEP:67052} The history is provided by the EMS personnel and the patient.  Seizures Seizure activity on arrival: no   Postictal symptoms: confusion   Return to baseline: no   Number of seizures this episode:  2 Progression:  Resolved Recent head injury:  No recent head injuries PTA treatment:  None History of seizures: yes        Prior to Admission medications   Medication Sig Start Date End Date Taking? Authorizing Provider  cyanocobalamin  1000 MCG tablet Take 1 tablet (1,000 mcg total) by mouth daily. 06/20/24   Krishnan, Gokul, MD  levETIRAcetam  (KEPPRA ) 1000 MG tablet Take 2 tablets (2,000 mg total) by mouth 2 (two) times daily. 06/19/24   Krishnan, Gokul, MD  oxyCODONE -acetaminophen  (PERCOCET) 10-325 MG tablet Take 1 tablet by mouth every 8 (eight) hours as needed for pain. 03/13/24   [provider]  Rimegepant Sulfate (NURTEC) 75 MG TBDP Take 1 tablet by mouth as needed (migraine).    [provider]    Allergies: Hydrocodone-acetaminophen     Review of Systems  Neurological:  Positive for seizures.    Updated Vital Signs BP (!) 160/91 (BP Location: Left Arm)   Pulse (!) 117   Temp 100.2 F (37.9 C) (Axillary)   Resp 20   Ht 5' 8 (1.727 m)   Wt 83 kg   SpO2 96%   BMI 27.82 kg/m   Physical Exam Vitals and nursing note reviewed.  Constitutional:      General: He  is not in acute distress.    Appearance: Normal appearance. He is well-developed.  HENT:     Head: Normocephalic and atraumatic.  Eyes:     Conjunctiva/sclera: Conjunctivae normal.  Cardiovascular:     Rate and Rhythm: Regular rhythm. Tachycardia present.     Heart sounds: No murmur heard. Pulmonary:     Effort: Pulmonary effort is normal. No respiratory distress.     Breath sounds: Normal breath sounds.  Abdominal:     Palpations: Abdomen is soft.     Tenderness: There is no abdominal tenderness. There is no guarding or rebound.  Musculoskeletal:        General: No swelling.     Cervical back: Neck supple.  Skin:    General: Skin is warm and dry.     Capillary Refill: Capillary refill takes less than 2 seconds.  Neurological:     General: No focal deficit present.     Mental Status: He is alert.     Motor: No weakness.     (all labs ordered are listed, but only abnormal results are displayed) Labs Reviewed  CBG MONITORING, ED    EKG: None  Radiology: No results found.  {Document cardiac monitor, telemetry assessment procedure when appropriate:32947} Procedures   Medications Ordered in the ED  levETIRAcetam  (KEPPRA ) undiluted injection 1,000 mg (has no  administration in time range)    Clinical Course as of 10/03/24 2322  Fri Oct 03, 2024  2154 Patient had a 1 minute tonic-clonic seizure witnessed by nurse.  I did stop by the time I got there.  I have ordered him to get some IV Ativan  also. [MB]  2216 Chest x-ray does not show any acute infiltrate.  Awaiting radiology reading. [MB]  2217 Patient was placed on oxygen after getting Ativan  [MB]  2230 Patient's tox screen positive for cocaine .  Will put him in for head CT as he has had possibly 3 seizures tonight [MB]    Clinical Course User Index [MB] Towana Ozell BROCKS, MD   {Click here for ABCD2, HEART and other calculators REFRESH Note before signing:1}                              Medical Decision  Making Amount and/or Complexity of Data Reviewed Labs: ordered. Radiology: ordered.  Risk Prescription drug management.   This patient complains of ***; this involves an extensive number of treatment Options and is a complaint that carries with it a high risk of complications and morbidity. The differential includes ***  I ordered, reviewed and interpreted labs, which included *** I ordered medication *** and reviewed PMP when indicated. I ordered imaging studies which included *** and I independently    visualized and interpreted imaging which showed *** Additional history obtained from *** Previous records obtained and reviewed *** I consulted *** and discussed lab and imaging findings and discussed disposition.  Cardiac monitoring reviewed, *** Social determinants considered, *** Critical Interventions: ***  After the interventions stated above, I reevaluated the patient and found *** Admission and further testing considered, ***   {Document critical care time when appropriate  Document review of labs and clinical decision tools ie CHADS2VASC2, etc  Document your independent review of radiology images and any outside records  Document your discussion with family members, caretakers and with consultants  Document social determinants of health affecting pt's care  Document your decision making why or why not admission, treatments were needed:32947:::1}   Final diagnoses:  None    ED Discharge Orders     None

## 2024-10-03 NOTE — ED Triage Notes (Addendum)
 Pt bib EMS from home after he had 2 witnessed seizures at home lasting approximately 1-2 mins per family. Pt with hx of seizures and EMS reports that pt has been compliant with seizure medications (Keppra ) per family. EMS reports cbg of 104.

## 2024-10-03 NOTE — ED Notes (Signed)
 Pt assisted with urinal

## 2024-10-03 NOTE — ED Notes (Addendum)
 Pt is snoring asleep in bed. Chest rise and fall is seen.

## 2024-10-03 NOTE — ED Notes (Signed)
 Pt had full body seizure lasting 1.5 to 2 minutes. 3L [place] applied. EDP made aware.

## 2024-10-03 NOTE — ED Notes (Signed)
 Pt transported to CT ?

## 2024-10-04 ENCOUNTER — Encounter (HOSPITAL_COMMUNITY): Payer: Self-pay | Admitting: Internal Medicine

## 2024-10-04 DIAGNOSIS — G40919 Epilepsy, unspecified, intractable, without status epilepticus: Secondary | ICD-10-CM | POA: Diagnosis not present

## 2024-10-04 LAB — CREATININE, SERUM
Creatinine, Ser: 0.98 mg/dL (ref 0.61–1.24)
GFR, Estimated: >60 mL/min (ref >60)

## 2024-10-04 LAB — CBC
HCT: 42.2 % (ref 39.0–52.0)
Hemoglobin: 14.7 g/dL (ref 13.0–17.0)
MCH: 29.3 pg (ref 26.0–34.0)
MCHC: 34.8 g/dL (ref 30.0–36.0)
MCV: 84.1 fL (ref 80.0–100.0)
Platelets: 318 K/uL (ref 150–400)
RBC: 5.02 MIL/uL (ref 4.22–5.81)
RDW: 12.8 % (ref 11.5–15.5)
WBC: 16.5 K/uL — ABNORMAL HIGH (ref 4.0–10.5)
nRBC: 0 % (ref 0.0–0.2)

## 2024-10-04 LAB — LACTIC ACID, PLASMA: Lactic Acid, Venous: 3.1 mmol/L (ref 0.5–1.9)

## 2024-10-04 LAB — CBG MONITORING, ED: Glucose-Capillary: 115 mg/dL — ABNORMAL HIGH (ref 70–99)

## 2024-10-04 MED ORDER — SODIUM CHLORIDE 0.9 % IV SOLN: INTRAVENOUS | Status: AC

## 2024-10-04 MED ORDER — OXYCODONE HCL 5 MG PO TABS
5.0000 mg | ORAL_TABLET | Freq: Four times a day (QID) | ORAL | Status: DC | PRN
  Administered 2024-10-04 – 2024-10-05 (×4): 10 mg via ORAL
  Filled 2024-10-04 (×4): qty 2

## 2024-10-04 MED ORDER — ONDANSETRON HCL 4 MG/2ML IJ SOLN: 4.0000 mg | Freq: Four times a day (QID) | INTRAMUSCULAR | Status: DC | PRN

## 2024-10-04 MED ORDER — ENOXAPARIN SODIUM 40 MG/0.4ML IJ SOSY
40.0000 mg | PREFILLED_SYRINGE | INTRAMUSCULAR | Status: DC
  Administered 2024-10-04 – 2024-10-05 (×2): 40 mg via SUBCUTANEOUS
  Filled 2024-10-04 (×2): qty 0.4

## 2024-10-04 MED ORDER — VITAMIN B-12 1000 MCG PO TABS
1000.0000 ug | ORAL_TABLET | Freq: Every day | ORAL | Status: DC
  Administered 2024-10-04 – 2024-10-05 (×2): 1000 ug via ORAL
  Filled 2024-10-04 (×2): qty 1

## 2024-10-04 MED ORDER — ACETAMINOPHEN 325 MG PO TABS
650.0000 mg | ORAL_TABLET | Freq: Once | ORAL | Status: DC
  Filled 2024-10-04: qty 2

## 2024-10-04 MED ORDER — ACETAMINOPHEN 650 MG RE SUPP
650.0000 mg | Freq: Four times a day (QID) | RECTAL | Status: DC | PRN
Start: 2024-10-04 — End: 2024-10-05

## 2024-10-04 MED ORDER — PANTOPRAZOLE SODIUM 40 MG PO TBEC
40.0000 mg | DELAYED_RELEASE_TABLET | Freq: Every day | ORAL | Status: DC
  Administered 2024-10-04 – 2024-10-05 (×2): 40 mg via ORAL
  Filled 2024-10-04 (×2): qty 1

## 2024-10-04 MED ORDER — KETOROLAC TROMETHAMINE 15 MG/ML IJ SOLN
15.0000 mg | Freq: Once | INTRAMUSCULAR | Status: AC
  Administered 2024-10-04: 15 mg via INTRAVENOUS
  Filled 2024-10-04: qty 1

## 2024-10-04 MED ORDER — LEVETIRACETAM 500 MG PO TABS
2000.0000 mg | ORAL_TABLET | Freq: Two times a day (BID) | ORAL | Status: DC
  Administered 2024-10-04 – 2024-10-05 (×2): 2000 mg via ORAL
  Filled 2024-10-04 (×3): qty 4

## 2024-10-04 MED ORDER — ACETAMINOPHEN 325 MG PO TABS
650.0000 mg | ORAL_TABLET | Freq: Four times a day (QID) | ORAL | Status: DC | PRN
  Filled 2024-10-04: qty 2

## 2024-10-04 MED ORDER — ONDANSETRON HCL 4 MG PO TABS: 4.0000 mg | ORAL_TABLET | Freq: Four times a day (QID) | ORAL | Status: DC | PRN

## 2024-10-04 NOTE — H&P (Signed)
 History and Physical    Patient: Jason [name] FMW:[id number] DOB: May 17, 1976 DOA: 10/03/2024 DOS: the patient was seen and examined on 10/04/2024  PCP: Patient, No Pcp Per   Patient coming from: Home  Chief Complaint:  Chief Complaint  Patient presents with   Seizures   HPI: EDIN KON is a 48 y.o. male with medical history significant of polysubstance abuse (cocaine  and marijuana), chronic pain, gastroesophageal reflux disease and seizure disorder; who presented to the hospital secondary to witnessed seizure at home.  Patient apparently has been compliant with his medications; he had 2 seizure activities at home and was brought to the emergency department where he had witnessed seizure by EDP.  Workup demonstrating CT head without acute intracranial normality.  UDS positive for cocaine  and marijuana.  Patient received Ativan  and Keppra  loading dose.  Postictal and somnolent; adequately following commands and demonstrating no fever, no chest pain, shortness of breath, hypoxia or any other complaints.  For the most part patient's blood work is unremarkable.  TRH has been consulted to place patient in the hospital for observation and further management.     Review of Systems: As mentioned in the history of present illness. All other systems reviewed and are negative. Past Medical History:  Diagnosis Date   Seizures [facility])    Past Surgical History:  Procedure Laterality Date   TONSILLECTOMY     Social History:  reports that he has never smoked. He does not have any smokeless tobacco history on file. He reports that he does not drink alcohol and does not use drugs.  Allergies  Allergen Reactions   Hydrocodone-Acetaminophen  Nausea And Vomiting    History reviewed. No pertinent family history.  Prior to Admission medications   Medication Sig Start Date End Date Taking? Authorizing Provider  cyanocobalamin  1000 MCG tablet Take 1 tablet (1,000 mcg total) by mouth daily.  06/20/24   Krishnan, Gokul, MD  levETIRAcetam  (KEPPRA ) 1000 MG tablet Take 2 tablets (2,000 mg total) by mouth 2 (two) times daily. 06/19/24   Krishnan, Gokul, MD  oxyCODONE -acetaminophen  (PERCOCET) 10-325 MG tablet Take 1 tablet by mouth every 8 (eight) hours as needed for pain. 03/13/24   [provider]  Rimegepant Sulfate (NURTEC) 75 MG TBDP Take 1 tablet by mouth as needed (migraine).    [provider]    Physical Exam: Vitals:   10/04/24 0630 10/04/24 0715 10/04/24 0845 10/04/24 0900  BP: (!) 132/92 131/85 121/86 (!) 131/90  Pulse: 92 94 88 87  Resp: 16 15 16 17   Temp:      TempSrc:      SpO2: 98% 97% 97% 97%  Weight:      Height:       General exam: Alert, oriented x 3, slightly somnolent but able to follow commands and in no major distress. Respiratory system: Clear to auscultation. Respiratory effort normal. Cardiovascular system: Good air movement bilaterally; no using accessory muscles.  Good saturation appreciated on exam.  Gastrointestinal system: Abdomen is nondistended, soft and nontender. No organomegaly or masses felt. Normal bowel sounds heard. Central nervous system: Following commands appropriately and moving 4 limbs spontaneously.  No focal neurological deficits. Extremities: No C/C/E, +pedal pulses Skin: No rashes, no petechiae. Psychiatry: Judgement and insight appear normal. Mood & affect appropriate.   Data Reviewed: UDS: Positive for cocaine  and marijuana Urinalysis: Specific gravity 1.0 11, negative nitrite, negative hemoglobin urine dipstick, negative leukocyte esterase and no bacteria. Comprehensive metabolic panel: Sodium 139, potassium 3.7, chloride 100, bicarb  24, BUN 13, creatinine 0.98, normal LFTs and GFR >60   Assessment and Plan: 1-seizures -Patient with breakthrough seizure most likely driven/trigger by recreational substances - He reports to be compliant with medication - Ativan  and Keppra  has been given - Patient's Keppra  has  been resumed - Cessation counseling for recreational substances have been provided - Will provide fluid resuscitation, advance diet and follow response. -Continue outpatient follow-up with neurology service. - Most likely discharge home on 10/05/2024.  2-polysubstance abuse - Cessation counseling provided - TOC to provide outpatient resources to help him quit.  3-GERD - Will continue PPI  4-lactic acidosis - In the setting of seizure activity - Will provide fluid resuscitation - No acute component of infection currently appreciated  5-history of B12 deficiency - Will continue B12 supplementation.    Advance Care Planning:   Code Status: Full Code   Consults: None  Family Communication: See below for x-ray reports.  Severity of Illness: The appropriate patient status for this patient is OBSERVATION. Observation status is judged to be reasonable and necessary in order to provide the required intensity of service to ensure the patient's safety. The patient's presenting symptoms, physical exam findings, and initial radiographic and laboratory data in the context of their medical condition is felt to place them at decreased risk for further clinical deterioration. Furthermore, it is anticipated that the patient will be medically stable for discharge from the hospital within 2 midnights of admission.   Author: Eric Nunnery, MD 10/04/2024 9:08 AM  For on call review [email or web address].

## 2024-10-04 NOTE — ED Notes (Addendum)
 Pt able to state his head hurts.Still pretty drowsy at this time.

## 2024-10-04 NOTE — TOC CM/SW Note (Signed)
 Transition of Care [facility]) - Inpatient Brief Assessment   Patient Details  Name: Jason [name] MRN: [id number] Date of Birth: Feb 04, 1976  Transition of Care [facility]) CM/SW Contact:    Lucie Lunger, LCSWA Phone Number: 10/04/2024, 9:57 AM   Clinical Narrative: Transition of Care Department [facility]) has reviewed patient and no TOC needs have been identified at this time. We will continue to monitor patient advancement through interdiciplinary progression rounds. If new patient transition needs arise, please place a TOC consult.  Transition of Care Asessment: Insurance and Status: Insurance coverage has been reviewed Patient has primary care physician: No (PCP list added to AVS) Home environment has been reviewed: From home Prior level of function:: Independent Prior/Current Home Services: No current home services Social Drivers of Health Review: SDOH reviewed no interventions necessary Readmission risk has been reviewed: Yes Transition of care needs: no transition of care needs at this time

## 2024-10-04 NOTE — ED Provider Notes (Signed)
 Care assumed at shift change. Patient with seizure d/o here after several seizures, awaiting CT and reassessment.  Physical Exam  BP (!) 151/89 (BP Location: Right Arm)   Pulse 99   Temp 100.2 F (37.9 C) (Axillary)   Resp 18   Ht 5' 8 (1.727 m)   Wt 83 kg   SpO2 99%   BMI 27.82 kg/m   Physical Exam  Procedures  Procedures  ED Course / MDM   Clinical Course as of 10/04/24 0509  Fri Oct 03, 2024  2154 Patient had a 1 minute tonic-clonic seizure witnessed by nurse.  I did stop by the time I got there.  I have ordered him to get some IV Ativan  also. [MB]  2216 Chest x-ray does not show any acute infiltrate.  Awaiting radiology reading. [MB]  2217 Patient was placed on oxygen after getting Ativan  [MB]  2230 Patient's tox screen positive for cocaine .  Will put him in for head CT as he has had possibly 3 seizures tonight [MB]  2323 I personally viewed the images from radiology studies and agree with radiologist interpretation: CT neg for acute process [CS]  Sat Oct 04, 2024  0126 Patient complaining of headache but still somnolent after seizure and ativan . Unable to sit up on his own. Will continue to monitor. APAP ordered when able to take PO.  [CS]  0427 Patient still somnolent, will answer questions but slow to respond and not able to sit up. Will discuss observation admission with hospitalist.  [CS]  [phone] Spoke with Dr. Manfred, Hospitalist, who will evaluate for admission.  [CS]    Clinical Course User Index [CS] Roselyn Carlin NOVAK, MD [MB] Towana Ozell BROCKS, MD   Medical Decision Making Problems Addressed: Seizure [facility]): acute illness or injury  Risk OTC drugs. Prescription drug management. Parenteral controlled substances. Decision regarding hospitalization.          Roselyn Carlin NOVAK, MD 10/04/24 [phone]

## 2024-10-04 NOTE — Discharge Instructions (Signed)

## 2024-10-04 NOTE — Plan of Care (Signed)

## 2024-10-04 NOTE — ED Notes (Addendum)
 Pt encouraged to sit up by himself and drink water. Stated, I can't do it. Continues to groan because of headache. Provider made aware.

## 2024-10-05 DIAGNOSIS — G40919 Epilepsy, unspecified, intractable, without status epilepticus: Secondary | ICD-10-CM | POA: Diagnosis not present

## 2024-10-05 LAB — CBC
HCT: 36.2 % — ABNORMAL LOW (ref 39.0–52.0)
Hemoglobin: 12.2 g/dL — ABNORMAL LOW (ref 13.0–17.0)
MCH: 29 pg (ref 26.0–34.0)
MCHC: 33.7 g/dL (ref 30.0–36.0)
MCV: 86.2 fL (ref 80.0–100.0)
Platelets: 245 K/uL (ref 150–400)
RBC: 4.2 MIL/uL — ABNORMAL LOW (ref 4.22–5.81)
RDW: 12.9 % (ref 11.5–15.5)
WBC: 8.6 K/uL (ref 4.0–10.5)
nRBC: 0 % (ref 0.0–0.2)

## 2024-10-05 LAB — BASIC METABOLIC PANEL WITH GFR
Anion gap: 10 (ref 5–15)
BUN: 13 mg/dL (ref 6–20)
CO2: 24 mmol/L (ref 22–32)
Calcium: 8.6 mg/dL — ABNORMAL LOW (ref 8.9–10.3)
Chloride: 106 mmol/L (ref 98–111)
Creatinine, Ser: 0.92 mg/dL (ref 0.61–1.24)
GFR, Estimated: >60 mL/min (ref >60)
Glucose, Bld: 111 mg/dL — ABNORMAL HIGH (ref 70–99)
Potassium: 3.5 mmol/L (ref 3.5–5.1)
Sodium: 140 mmol/L (ref 135–145)

## 2024-10-05 LAB — MAGNESIUM: Magnesium: 2.2 mg/dL (ref 1.7–2.4)

## 2024-10-05 LAB — PHOSPHORUS: Phosphorus: 2.4 mg/dL — ABNORMAL LOW (ref 2.5–4.6)

## 2024-10-05 MED ORDER — PANTOPRAZOLE SODIUM 40 MG PO TBEC: 40.0000 mg | DELAYED_RELEASE_TABLET | Freq: Every day | ORAL | 1 refills | Status: AC

## 2024-10-05 MED ORDER — LEVETIRACETAM 1000 MG PO TABS: 2000.0000 mg | ORAL_TABLET | Freq: Two times a day (BID) | ORAL | 2 refills | Status: AC

## 2024-10-05 NOTE — Progress Notes (Addendum)
 Nurse at bedside,patient alert and oriented times four,patient just a little slow with his responses.Patient did c/o generalize pain,a achy,sore,discomfort to his body,that's rated a 7.a constant pain.Patient was able to take medications whole with no issues. Plan of care on going.

## 2024-10-05 NOTE — Discharge Summary (Signed)
 Physician Discharge Summary   Patient: Jason [name] MRN: [id number] DOB: 04-12-1976  Admit date:     10/03/2024  Discharge date: 10/05/24  Discharge Physician: Eric Nunnery   PCP: Patient, No Pcp Per   Recommendations at discharge:  Repeat basic metabolic panel to follow electrolytes and renal function Repeat CBC to follow hemoglobin trend/stability Continue assisting patient with recreational drug cessation Make sure patient follow-up with neurology service as instructed.  Discharge Diagnoses: Principal Problem:   Breakthrough seizure [facility])  Brief Hospital admission narrative:  Jason [name] is a 48 y.o. male with medical history significant of polysubstance abuse (cocaine  and marijuana), chronic pain, gastroesophageal reflux disease and seizure disorder; who presented to the hospital secondary to witnessed seizure at home.  Patient apparently has been compliant with his medications; he had 2 seizure activities at home and was brought to the emergency department where he had witnessed seizure by EDP.   Workup demonstrating CT head without acute intracranial normality.  UDS positive for cocaine  and marijuana.  Patient received Ativan  and Keppra  loading dose.  Postictal and somnolent; adequately following commands and demonstrating no fever, no chest pain, shortness of breath, hypoxia or any other complaints.  For the most part patient's blood work is unremarkable.   TRH has been consulted to place patient in the hospital for observation and further management.  Assessment and Plan: 1-history of seizures/breakthrough seizures - Appears to be triggered by recreational substances usage. - Patient UDS positive for marijuana and cocaine  - Cessation counseling provided - Patient was loaded with Keppra  and also received Ativan  - No further seizure activity appreciated throughout hospitalization - Patient will continue Keppra  2000 mg twice a day as previously prescribed - Outpatient  follow-up with neurology service recommended.  2-polysubstance abuse - Cessation counseling provided.  3-GERD - Continue PPI.  4-mild lactic acidosis - Appears to be in the setting of seizure disorder - Fluid resuscitation provided - Hemodynamically stable and demonstrating normal renal function.  5-history of B12 deficiency - Continue B12 supplementation.  6-history of chronic pain syndrome - Resume home analgesic therapy.  Consultants: None Procedures performed: See below for x-ray report. Disposition: Home Diet recommendation: Regular diet.  DISCHARGE MEDICATION: Allergies as of 10/05/2024       Reactions   Hydrocodone-acetaminophen  Nausea And Vomiting        Medication List     TAKE these medications    cyanocobalamin  1000 MCG tablet Commonly known as: VITAMIN B12 Take 1 tablet (1,000 mcg total) by mouth daily.   GOODY HEADACHE PO Take 1 Package by mouth daily as needed (migraines).   levETIRAcetam  1000 MG tablet Commonly known as: KEPPRA  Take 2 tablets (2,000 mg total) by mouth 2 (two) times daily.   Nurtec 75 MG Tbdp Generic drug: Rimegepant Sulfate Take 1 tablet by mouth as needed (migraine).   oxyCODONE  15 MG immediate release tablet Commonly known as: ROXICODONE  Take 15 mg by mouth every 6 (six) hours as needed for pain.   pantoprazole 40 MG tablet Commonly known as: PROTONIX Take 1 tablet (40 mg total) by mouth daily. Start taking on: October 06, 2024        Follow-up Information     Georjean Darice HERO, MD. Schedule an appointment as soon as possible for a visit in 2 week(s).   Specialty: Neurology Contact information: [address] [place] [address] [phone]                Discharge Exam: Jason [name]  10/03/24 2135  Weight: 83 kg   General exam: Alert, awake, oriented x 3 Respiratory system: Clear to auscultation. Respiratory effort normal. Cardiovascular system:RRR. No murmurs, rubs,  gallops. Gastrointestinal system: Abdomen is nondistended, soft and nontender. No organomegaly or masses felt. Normal bowel sounds heard. Central nervous system: Alert and oriented. No focal neurological deficits. Extremities: No C/C/E, +pedal pulses Skin: No rashes, lesions or ulcers Psychiatry: Judgement and insight appear normal. Mood & affect appropriate.    Condition at discharge: Stable and improved.  The results of significant diagnostics from this hospitalization (including imaging, microbiology, ancillary and laboratory) are listed below for reference.   Imaging Studies: CT Head Wo Contrast Result Date: 10/03/2024 EXAM: CT HEAD WITHOUT CONTRAST 10/03/2024 10:58:37 PM TECHNIQUE: CT of the head was performed without the administration of intravenous contrast. Automated exposure control, iterative reconstruction, and/or weight based adjustment of the mA/kV was utilized to reduce the radiation dose to as low as reasonably achievable. COMPARISON: 06/14/2024 CLINICAL HISTORY: Seizure disorder, clinical change. 2 witnessed seizures at home lasting approximately 1-2 mins per family. Pt with hx of seizures and EMS reports that pt has been compliant with seizure medications (Keppra ) per family. FINDINGS: BRAIN AND VENTRICLES: No acute hemorrhage. No evidence of acute infarct. No hydrocephalus. No extra-axial collection. No mass effect or midline shift. ORBITS: No acute abnormality. SINUSES: Right maxillary sinus mucous retention cyst. SOFT TISSUES AND SKULL: No acute soft tissue abnormality. No skull fracture. IMPRESSION: 1. No acute intracranial abnormality. Electronically signed by: Norman Gatlin MD 10/03/2024 11:19 PM EDT RP Workstation: [id number]   DG Chest Port 1 View Result Date: 10/03/2024 CLINICAL DATA:  Seizure EXAM: PORTABLE CHEST 1 VIEW COMPARISON:  06/14/2024 FINDINGS: Single frontal view of the chest demonstrates an unremarkable cardiac silhouette. No acute airspace disease,  effusion, or pneumothorax. No acute bony abnormalities. IMPRESSION: 1. No acute intrathoracic process. Electronically Signed   By: Ozell Daring M.D.   On: 10/03/2024 22:34    Microbiology: Results for orders placed or performed during the hospital encounter of 06/14/24  MRSA Next Gen by PCR, Nasal     Status: None   Collection Time: 06/14/24  6:21 PM   Specimen: Nasal Mucosa; Nasal Swab  Result Value Ref Range Status   MRSA by PCR Next Gen NOT DETECTED NOT DETECTED Final    Comment: (NOTE) The GeneXpert MRSA Assay (FDA approved for NASAL specimens only), is one component of a comprehensive MRSA colonization surveillance program. It is not intended to diagnose MRSA infection nor to guide or monitor treatment for MRSA infections. Test performance is not FDA approved in patients less than 48 years old. Performed at [facility], [address] [place], [place] [address]   MRSA Next Gen by PCR, Nasal     Status: None   Collection Time: 06/14/24 11:55 PM   Specimen: Nasal Mucosa; Nasal Swab  Result Value Ref Range Status   MRSA by PCR Next Gen NOT DETECTED NOT DETECTED Final    Comment: (NOTE) The GeneXpert MRSA Assay (FDA approved for NASAL specimens only), is one component of a comprehensive MRSA colonization surveillance program. It is not intended to diagnose MRSA infection nor to guide or monitor treatment for MRSA infections. Test performance is not FDA approved in patients less than 16 years old. Performed at [facility], [address] [place], [place] [address]   Culture, blood (Routine X 2) w Reflex to ID Panel     Status: None   Collection Time: 06/16/24  4:54  PM   Specimen: BLOOD  Result Value Ref Range Status   Specimen Description BLOOD SITE NOT SPECIFIED  Final   Special Requests   Final    BOTTLES DRAWN AEROBIC ONLY Blood Culture adequate volume   Culture   Final    NO GROWTH 5 DAYS Performed at [facility], [address] [place], [place]  [address]    Report Status 06/21/2024 FINAL  Final  Culture, blood (Routine X 2) w Reflex to ID Panel     Status: None   Collection Time: 06/16/24  5:29 PM   Specimen: BLOOD  Result Value Ref Range Status   Specimen Description BLOOD SITE NOT SPECIFIED  Final   Special Requests   Final    BOTTLES DRAWN AEROBIC ONLY Blood Culture results may not be optimal due to an inadequate volume of blood received in culture bottles   Culture   Final    NO GROWTH 5 DAYS Performed at [facility], [address], [place], [place] [address]    Report Status 06/21/2024 FINAL  Final    Labs: CBC: Recent Labs  Lab 10/03/24 2202 10/04/24 0918 10/05/24 0357  WBC 12.5* 16.5* 8.6  NEUTROABS 10.7*  --   --   HGB 14.8 14.7 12.2*  HCT 43.1 42.2 36.2*  MCV 85.0 84.1 86.2  PLT 300 318 245   Basic Metabolic Panel: Recent Labs  Lab 10/03/24 2202 10/04/24 0918 10/05/24 0357  NA 139  --  140  K 3.7  --  3.5  CL 100  --  106  CO2 24  --  24  GLUCOSE 104*  --  111*  BUN 13  --  13  CREATININE 0.98 0.98 0.92  CALCIUM 9.5  --  8.6*  MG  --   --  2.2  PHOS  --   --  2.4*   Liver Function Tests: Recent Labs  Lab 10/03/24 2202  AST 29  ALT 21  ALKPHOS 73  BILITOT 0.3  PROT 8.0  ALBUMIN 4.6   CBG: Recent Labs  Lab 10/04/24 0049  GLUCAP 115*    Discharge time spent:  35 minutes.  Signed: Eric Nunnery, MD [facility] 10/05/2024

## 2024-10-05 NOTE — Plan of Care (Signed)
